# Patient Record
Sex: Male | Born: 1935 | Race: Black or African American | Hispanic: No | Smoking: Former smoker
Health system: Southern US, Community
[De-identification: ages and names within clinical notes are randomized; demographics above are authoritative.]

## PROBLEM LIST (undated history)

## (undated) DIAGNOSIS — E785 Hyperlipidemia, unspecified: Secondary | ICD-10-CM

## (undated) DIAGNOSIS — D72829 Elevated white blood cell count, unspecified: Secondary | ICD-10-CM

## (undated) DIAGNOSIS — I4891 Unspecified atrial fibrillation: Secondary | ICD-10-CM

## (undated) DIAGNOSIS — E119 Type 2 diabetes mellitus without complications: Secondary | ICD-10-CM

## (undated) DIAGNOSIS — N289 Disorder of kidney and ureter, unspecified: Secondary | ICD-10-CM

## (undated) DIAGNOSIS — I214 Non-ST elevation (NSTEMI) myocardial infarction: Secondary | ICD-10-CM

## (undated) DIAGNOSIS — J45909 Unspecified asthma, uncomplicated: Secondary | ICD-10-CM

## (undated) DIAGNOSIS — D539 Nutritional anemia, unspecified: Secondary | ICD-10-CM

## (undated) DIAGNOSIS — D696 Thrombocytopenia, unspecified: Secondary | ICD-10-CM

## (undated) DIAGNOSIS — I509 Heart failure, unspecified: Secondary | ICD-10-CM

## (undated) DIAGNOSIS — J449 Chronic obstructive pulmonary disease, unspecified: Secondary | ICD-10-CM

## (undated) DIAGNOSIS — I10 Essential (primary) hypertension: Secondary | ICD-10-CM

---

## 2016-10-15 DIAGNOSIS — I214 Non-ST elevation (NSTEMI) myocardial infarction: Secondary | ICD-10-CM

## 2016-10-15 HISTORY — DX: Non-ST elevation (NSTEMI) myocardial infarction: I21.4

## 2017-01-12 ENCOUNTER — Encounter (HOSPITAL_COMMUNITY): Payer: Self-pay | Admitting: Emergency Medicine

## 2017-01-12 ENCOUNTER — Inpatient Hospital Stay (HOSPITAL_COMMUNITY)
Admission: EM | Admit: 2017-01-12 | Discharge: 2017-01-17 | DRG: 871 | Disposition: E | Payer: Medicare Other | Attending: Internal Medicine | Admitting: Internal Medicine

## 2017-01-12 ENCOUNTER — Emergency Department (HOSPITAL_COMMUNITY): Payer: Medicare Other

## 2017-01-12 DIAGNOSIS — D631 Anemia in chronic kidney disease: Secondary | ICD-10-CM | POA: Diagnosis not present

## 2017-01-12 DIAGNOSIS — I428 Other cardiomyopathies: Secondary | ICD-10-CM | POA: Diagnosis present

## 2017-01-12 DIAGNOSIS — J9621 Acute and chronic respiratory failure with hypoxia: Secondary | ICD-10-CM | POA: Diagnosis present

## 2017-01-12 DIAGNOSIS — R079 Chest pain, unspecified: Secondary | ICD-10-CM

## 2017-01-12 DIAGNOSIS — Y95 Nosocomial condition: Secondary | ICD-10-CM | POA: Diagnosis present

## 2017-01-12 DIAGNOSIS — D696 Thrombocytopenia, unspecified: Secondary | ICD-10-CM | POA: Diagnosis not present

## 2017-01-12 DIAGNOSIS — N289 Disorder of kidney and ureter, unspecified: Secondary | ICD-10-CM

## 2017-01-12 DIAGNOSIS — I5032 Chronic diastolic (congestive) heart failure: Secondary | ICD-10-CM | POA: Diagnosis present

## 2017-01-12 DIAGNOSIS — A4159 Other Gram-negative sepsis: Secondary | ICD-10-CM | POA: Diagnosis present

## 2017-01-12 DIAGNOSIS — Z7901 Long term (current) use of anticoagulants: Secondary | ICD-10-CM

## 2017-01-12 DIAGNOSIS — E785 Hyperlipidemia, unspecified: Secondary | ICD-10-CM | POA: Diagnosis not present

## 2017-01-12 DIAGNOSIS — A419 Sepsis, unspecified organism: Secondary | ICD-10-CM

## 2017-01-12 DIAGNOSIS — E440 Moderate protein-calorie malnutrition: Secondary | ICD-10-CM | POA: Insufficient documentation

## 2017-01-12 DIAGNOSIS — I132 Hypertensive heart and chronic kidney disease with heart failure and with stage 5 chronic kidney disease, or end stage renal disease: Secondary | ICD-10-CM | POA: Diagnosis present

## 2017-01-12 DIAGNOSIS — R0602 Shortness of breath: Secondary | ICD-10-CM

## 2017-01-12 DIAGNOSIS — Z794 Long term (current) use of insulin: Secondary | ICD-10-CM

## 2017-01-12 DIAGNOSIS — I519 Heart disease, unspecified: Secondary | ICD-10-CM | POA: Diagnosis not present

## 2017-01-12 DIAGNOSIS — E1122 Type 2 diabetes mellitus with diabetic chronic kidney disease: Secondary | ICD-10-CM | POA: Diagnosis present

## 2017-01-12 DIAGNOSIS — J441 Chronic obstructive pulmonary disease with (acute) exacerbation: Secondary | ICD-10-CM | POA: Diagnosis present

## 2017-01-12 DIAGNOSIS — E872 Acidosis: Secondary | ICD-10-CM | POA: Diagnosis present

## 2017-01-12 DIAGNOSIS — J189 Pneumonia, unspecified organism: Secondary | ICD-10-CM | POA: Diagnosis present

## 2017-01-12 DIAGNOSIS — J440 Chronic obstructive pulmonary disease with acute lower respiratory infection: Secondary | ICD-10-CM | POA: Diagnosis present

## 2017-01-12 DIAGNOSIS — J90 Pleural effusion, not elsewhere classified: Secondary | ICD-10-CM

## 2017-01-12 DIAGNOSIS — Z7982 Long term (current) use of aspirin: Secondary | ICD-10-CM | POA: Diagnosis not present

## 2017-01-12 DIAGNOSIS — Z01818 Encounter for other preprocedural examination: Secondary | ICD-10-CM

## 2017-01-12 DIAGNOSIS — K219 Gastro-esophageal reflux disease without esophagitis: Secondary | ICD-10-CM | POA: Diagnosis present

## 2017-01-12 DIAGNOSIS — Z6823 Body mass index (BMI) 23.0-23.9, adult: Secondary | ICD-10-CM

## 2017-01-12 DIAGNOSIS — E2740 Unspecified adrenocortical insufficiency: Secondary | ICD-10-CM | POA: Diagnosis present

## 2017-01-12 DIAGNOSIS — G9341 Metabolic encephalopathy: Secondary | ICD-10-CM | POA: Diagnosis present

## 2017-01-12 DIAGNOSIS — J9620 Acute and chronic respiratory failure, unspecified whether with hypoxia or hypercapnia: Secondary | ICD-10-CM

## 2017-01-12 DIAGNOSIS — Z515 Encounter for palliative care: Secondary | ICD-10-CM | POA: Diagnosis present

## 2017-01-12 DIAGNOSIS — R579 Shock, unspecified: Secondary | ICD-10-CM | POA: Diagnosis not present

## 2017-01-12 DIAGNOSIS — I248 Other forms of acute ischemic heart disease: Secondary | ICD-10-CM | POA: Diagnosis not present

## 2017-01-12 DIAGNOSIS — I4891 Unspecified atrial fibrillation: Secondary | ICD-10-CM | POA: Diagnosis not present

## 2017-01-12 DIAGNOSIS — Z87891 Personal history of nicotine dependence: Secondary | ICD-10-CM

## 2017-01-12 DIAGNOSIS — E441 Mild protein-calorie malnutrition: Secondary | ICD-10-CM | POA: Diagnosis present

## 2017-01-12 DIAGNOSIS — R6520 Severe sepsis without septic shock: Secondary | ICD-10-CM

## 2017-01-12 DIAGNOSIS — I255 Ischemic cardiomyopathy: Secondary | ICD-10-CM | POA: Diagnosis not present

## 2017-01-12 DIAGNOSIS — J9601 Acute respiratory failure with hypoxia: Secondary | ICD-10-CM | POA: Diagnosis not present

## 2017-01-12 DIAGNOSIS — E11649 Type 2 diabetes mellitus with hypoglycemia without coma: Secondary | ICD-10-CM | POA: Diagnosis present

## 2017-01-12 DIAGNOSIS — E669 Obesity, unspecified: Secondary | ICD-10-CM | POA: Diagnosis not present

## 2017-01-12 DIAGNOSIS — J811 Chronic pulmonary edema: Secondary | ICD-10-CM | POA: Diagnosis present

## 2017-01-12 DIAGNOSIS — N186 End stage renal disease: Secondary | ICD-10-CM | POA: Diagnosis present

## 2017-01-12 DIAGNOSIS — K7460 Unspecified cirrhosis of liver: Secondary | ICD-10-CM | POA: Diagnosis present

## 2017-01-12 DIAGNOSIS — Z9981 Dependence on supplemental oxygen: Secondary | ICD-10-CM | POA: Diagnosis not present

## 2017-01-12 DIAGNOSIS — R748 Abnormal levels of other serum enzymes: Secondary | ICD-10-CM | POA: Diagnosis not present

## 2017-01-12 DIAGNOSIS — Z66 Do not resuscitate: Secondary | ICD-10-CM | POA: Diagnosis present

## 2017-01-12 DIAGNOSIS — Z452 Encounter for adjustment and management of vascular access device: Secondary | ICD-10-CM

## 2017-01-12 DIAGNOSIS — Z86718 Personal history of other venous thrombosis and embolism: Secondary | ICD-10-CM

## 2017-01-12 DIAGNOSIS — I482 Chronic atrial fibrillation, unspecified: Secondary | ICD-10-CM | POA: Diagnosis present

## 2017-01-12 DIAGNOSIS — Z79899 Other long term (current) drug therapy: Secondary | ICD-10-CM

## 2017-01-12 DIAGNOSIS — R6521 Severe sepsis with septic shock: Secondary | ICD-10-CM | POA: Diagnosis present

## 2017-01-12 DIAGNOSIS — I252 Old myocardial infarction: Secondary | ICD-10-CM

## 2017-01-12 DIAGNOSIS — N189 Chronic kidney disease, unspecified: Secondary | ICD-10-CM

## 2017-01-12 DIAGNOSIS — J449 Chronic obstructive pulmonary disease, unspecified: Secondary | ICD-10-CM | POA: Diagnosis not present

## 2017-01-12 DIAGNOSIS — E1169 Type 2 diabetes mellitus with other specified complication: Secondary | ICD-10-CM | POA: Diagnosis present

## 2017-01-12 DIAGNOSIS — R071 Chest pain on breathing: Secondary | ICD-10-CM | POA: Diagnosis not present

## 2017-01-12 DIAGNOSIS — N179 Acute kidney failure, unspecified: Secondary | ICD-10-CM | POA: Diagnosis not present

## 2017-01-12 DIAGNOSIS — S2241XA Multiple fractures of ribs, right side, initial encounter for closed fracture: Secondary | ICD-10-CM | POA: Diagnosis present

## 2017-01-12 DIAGNOSIS — S2249XA Multiple fractures of ribs, unspecified side, initial encounter for closed fracture: Secondary | ICD-10-CM

## 2017-01-12 DIAGNOSIS — I2699 Other pulmonary embolism without acute cor pulmonale: Secondary | ICD-10-CM

## 2017-01-12 DIAGNOSIS — I10 Essential (primary) hypertension: Secondary | ICD-10-CM | POA: Diagnosis present

## 2017-01-12 DIAGNOSIS — Z833 Family history of diabetes mellitus: Secondary | ICD-10-CM

## 2017-01-12 HISTORY — DX: Hypocalcemia: E83.51

## 2017-01-12 HISTORY — DX: Unspecified atrial fibrillation: I48.91

## 2017-01-12 HISTORY — DX: Thrombocytopenia, unspecified: D69.6

## 2017-01-12 HISTORY — DX: Chronic obstructive pulmonary disease, unspecified: J44.9

## 2017-01-12 HISTORY — DX: Heart failure, unspecified: I50.9

## 2017-01-12 HISTORY — DX: Essential (primary) hypertension: I10

## 2017-01-12 HISTORY — DX: Hyperlipidemia, unspecified: E78.5

## 2017-01-12 HISTORY — DX: Elevated white blood cell count, unspecified: D72.829

## 2017-01-12 HISTORY — DX: Disorder of kidney and ureter, unspecified: N28.9

## 2017-01-12 HISTORY — DX: Non-ST elevation (NSTEMI) myocardial infarction: I21.4

## 2017-01-12 HISTORY — DX: Type 2 diabetes mellitus without complications: E11.9

## 2017-01-12 HISTORY — DX: Unspecified asthma, uncomplicated: J45.909

## 2017-01-12 HISTORY — DX: Nutritional anemia, unspecified: D53.9

## 2017-01-12 LAB — BASIC METABOLIC PANEL
ANION GAP: 10 (ref 5–15)
BUN: 24 mg/dL — ABNORMAL HIGH (ref 6–20)
CHLORIDE: 95 mmol/L — AB (ref 101–111)
CO2: 28 mmol/L (ref 22–32)
Calcium: 9 mg/dL (ref 8.9–10.3)
Creatinine, Ser: 3.51 mg/dL — ABNORMAL HIGH (ref 0.61–1.24)
GFR calc non Af Amer: 15 mL/min — ABNORMAL LOW (ref >60)
GFR, EST AFRICAN AMERICAN: 17 mL/min — AB (ref >60)
GLUCOSE: 171 mg/dL — AB (ref 65–99)
Potassium: 3.4 mmol/L — ABNORMAL LOW (ref 3.5–5.1)
Sodium: 133 mmol/L — ABNORMAL LOW (ref 135–145)

## 2017-01-12 LAB — CBC
HCT: 28.7 % — ABNORMAL LOW (ref 39.0–52.0)
HEMOGLOBIN: 9.1 g/dL — AB (ref 13.0–17.0)
MCH: 33.1 pg (ref 26.0–34.0)
MCHC: 31.7 g/dL (ref 30.0–36.0)
MCV: 104.4 fL — AB (ref 78.0–100.0)
Platelets: 163 10*3/uL (ref 150–400)
RBC: 2.75 MIL/uL — AB (ref 4.22–5.81)
RDW: 18.5 % — ABNORMAL HIGH (ref 11.5–15.5)
WBC: 12 10*3/uL — ABNORMAL HIGH (ref 4.0–10.5)

## 2017-01-12 LAB — LIPASE, BLOOD: Lipase: 15 U/L (ref 11–51)

## 2017-01-12 LAB — HEPATIC FUNCTION PANEL
ALBUMIN: 3.2 g/dL — AB (ref 3.5–5.0)
ALT: 15 U/L — AB (ref 17–63)
AST: 20 U/L (ref 15–41)
Alkaline Phosphatase: 99 U/L (ref 38–126)
BILIRUBIN INDIRECT: 0.7 mg/dL (ref 0.3–0.9)
Bilirubin, Direct: 0.4 mg/dL (ref 0.1–0.5)
TOTAL PROTEIN: 7.4 g/dL (ref 6.5–8.1)
Total Bilirubin: 1.1 mg/dL (ref 0.3–1.2)

## 2017-01-12 LAB — LIPID PANEL
Cholesterol: 152 mg/dL (ref 0–200)
HDL: 56 mg/dL (ref >40)
LDL CALC: 80 mg/dL (ref 0–99)
Total CHOL/HDL Ratio: 2.7 RATIO
Triglycerides: 79 mg/dL (ref <150)
VLDL: 16 mg/dL (ref 0–40)

## 2017-01-12 LAB — GLUCOSE, CAPILLARY: Glucose-Capillary: 161 mg/dL — ABNORMAL HIGH (ref 65–99)

## 2017-01-12 LAB — TROPONIN I
TROPONIN I: 0.03 ng/mL — AB (ref <0.03)
Troponin I: 0.05 ng/mL (ref <0.03)
Troponin I: 0.06 ng/mL (ref <0.03)

## 2017-01-12 LAB — BRAIN NATRIURETIC PEPTIDE: B Natriuretic Peptide: 2371.7 pg/mL — ABNORMAL HIGH (ref 0.0–100.0)

## 2017-01-12 LAB — PROTIME-INR
INR: 1.63
PROTHROMBIN TIME: 19.6 s — AB (ref 11.4–15.2)

## 2017-01-12 MED ORDER — DOCUSATE SODIUM 283 MG RE ENEM
1.0000 | ENEMA | RECTAL | Status: DC | PRN
  Filled 2017-01-12: qty 1

## 2017-01-12 MED ORDER — NEPRO/CARBSTEADY PO LIQD
237.0000 mL | Freq: Every day | ORAL | Status: DC
  Filled 2017-01-12 (×2): qty 237

## 2017-01-12 MED ORDER — SEVELAMER CARBONATE 800 MG PO TABS
800.0000 mg | ORAL_TABLET | Freq: Three times a day (TID) | ORAL | Status: DC
  Administered 2017-01-14: 800 mg via ORAL
  Filled 2017-01-12: qty 1

## 2017-01-12 MED ORDER — ZOLPIDEM TARTRATE 5 MG PO TABS: 5.0000 mg | ORAL_TABLET | Freq: Every evening | ORAL | Status: DC | PRN

## 2017-01-12 MED ORDER — HYDROXYZINE HCL 25 MG PO TABS: 25.0000 mg | ORAL_TABLET | Freq: Three times a day (TID) | ORAL | Status: DC | PRN

## 2017-01-12 MED ORDER — SODIUM CHLORIDE 0.9 % IV SOLN: 250.0000 mL | INTRAVENOUS | Status: DC | PRN

## 2017-01-12 MED ORDER — SODIUM CHLORIDE 0.9% FLUSH: 3.0000 mL | INTRAVENOUS | Status: DC | PRN

## 2017-01-12 MED ORDER — SODIUM CHLORIDE 0.9% FLUSH
3.0000 mL | Freq: Two times a day (BID) | INTRAVENOUS | Status: DC
  Administered 2017-01-12 – 2017-01-16 (×5): 3 mL via INTRAVENOUS

## 2017-01-12 MED ORDER — CALCIUM CARBONATE ANTACID 1250 MG/5ML PO SUSP
500.0000 mg | Freq: Four times a day (QID) | ORAL | Status: DC | PRN
  Filled 2017-01-12: qty 5

## 2017-01-12 MED ORDER — INSULIN ASPART 100 UNIT/ML ~~LOC~~ SOLN
0.0000 [IU] | Freq: Three times a day (TID) | SUBCUTANEOUS | Status: DC
Start: 2017-01-13 — End: 2017-01-13

## 2017-01-12 MED ORDER — INSULIN ASPART 100 UNIT/ML ~~LOC~~ SOLN: 4.0000 [IU] | Freq: Three times a day (TID) | SUBCUTANEOUS | Status: DC

## 2017-01-12 MED ORDER — ACETAMINOPHEN 325 MG PO TABS: 650.0000 mg | ORAL_TABLET | ORAL | Status: DC | PRN

## 2017-01-12 MED ORDER — ATORVASTATIN CALCIUM 20 MG PO TABS
20.0000 mg | ORAL_TABLET | Freq: Every day | ORAL | Status: DC
  Administered 2017-01-12 – 2017-01-15 (×2): 20 mg via ORAL
  Filled 2017-01-12 (×2): qty 1

## 2017-01-12 MED ORDER — PANTOPRAZOLE SODIUM 40 MG PO TBEC
40.0000 mg | DELAYED_RELEASE_TABLET | Freq: Two times a day (BID) | ORAL | Status: DC
  Administered 2017-01-12: 40 mg via ORAL
  Filled 2017-01-12: qty 1

## 2017-01-12 MED ORDER — ASPIRIN 81 MG PO CHEW: 81.0000 mg | CHEWABLE_TABLET | Freq: Every day | ORAL | Status: DC

## 2017-01-12 MED ORDER — NEPRO/CARBSTEADY PO LIQD
237.0000 mL | Freq: Three times a day (TID) | ORAL | Status: DC | PRN
  Filled 2017-01-12: qty 237

## 2017-01-12 MED ORDER — ONDANSETRON HCL 4 MG/2ML IJ SOLN: 4.0000 mg | Freq: Four times a day (QID) | INTRAMUSCULAR | Status: DC | PRN

## 2017-01-12 MED ORDER — FOLIC ACID-VIT B6-VIT B12 2.5-25-1 MG PO TABS
1.0000 | ORAL_TABLET | Freq: Every day | ORAL | Status: DC
  Administered 2017-01-12 – 2017-01-16 (×3): 1 via ORAL
  Filled 2017-01-12 (×6): qty 1

## 2017-01-12 MED ORDER — APIXABAN 2.5 MG PO TABS
2.5000 mg | ORAL_TABLET | Freq: Two times a day (BID) | ORAL | Status: DC
  Administered 2017-01-12: 2.5 mg via ORAL
  Filled 2017-01-12: qty 1

## 2017-01-12 MED ORDER — FENTANYL CITRATE (PF) 100 MCG/2ML IJ SOLN
12.5000 ug | INTRAMUSCULAR | Status: DC | PRN
  Administered 2017-01-14 – 2017-01-15 (×5): 12.5 ug via INTRAVENOUS
  Filled 2017-01-12 (×5): qty 2

## 2017-01-12 MED ORDER — APIXABAN 5 MG PO TABS: 5.0000 mg | ORAL_TABLET | Freq: Two times a day (BID) | ORAL | Status: DC

## 2017-01-12 MED ORDER — SORBITOL 70 % SOLN: 30.0000 mL | Status: DC | PRN

## 2017-01-12 MED ORDER — CAMPHOR-MENTHOL 0.5-0.5 % EX LOTN
1.0000 "application " | TOPICAL_LOTION | Freq: Three times a day (TID) | CUTANEOUS | Status: DC | PRN
  Filled 2017-01-12: qty 222

## 2017-01-12 NOTE — ED Provider Notes (Signed)
[facility]Y DEPT Provider Note   CSN: [id number] Arrival date & time: 12/20/2016  1047     History   Chief Complaint Chief Complaint  Patient presents with  . Chest Pain    HPI Louis Sutton is a 83 y.o. male.  HPI Pt presents to the ED with complaints of chest pain that started yesterday evening.  It continued throughout the night and resolved some time today.  At this point his pain is gone and he is feeling fine.  The pain was in the lower chest bilaterally down to his upper abdomen.  He felt short of breath.  No fevers, or cough.  No vomiting or diarrhea.    Pt has history of ESRD.  He was dialyzed yesterday.  He was in [facility] over the past weekend for trouble with chest pain, nausea and vomiting.  Per family symptoms were felt to be related to fluid overload.  Past Medical History:  Diagnosis Date  . Asthma   . Atrial fibrillation (HCC)   . CHF (congestive heart failure) (HCC)   . COPD (chronic obstructive pulmonary disease) (HCC)    2L home O2  . Diabetes mellitus without complication (HCC)   . Dyslipidemia   . Hypertension   . Hypocalcemia   . Leukocytosis   . Macrocytic anemia   . NSTEMI (non-ST elevated myocardial infarction) (HCC) 10/15/2016  . Renal disorder    ESRD - [facility] HD  . Thrombocytopenia [facility])     Patient Active Problem List   Diagnosis Date Noted  . Chest pain 01/07/2017  . Chronic diastolic CHF (congestive heart failure) (HCC) 12/22/2016  . ESRD (end stage renal disease) (HCC) 01/03/2017  . Anemia of renal disease 01/01/2017  . Diabetes mellitus type 2 in obese (HCC) 12/21/2016  . Atrial fibrillation, chronic (HCC) 12/31/2016  . Current use of long term anticoagulation 12/25/2016  . Essential hypertension 12/26/2016  . Hyperlipidemia 01/13/2017  . COPD (chronic obstructive pulmonary disease) (HCC) 12/23/2016  . Mild protein-calorie malnutrition (HCC) 01/11/2017    History reviewed. No pertinent surgical  history.     Home Medications    Prior to Admission medications   Medication Sig Start Date End Date Taking? Authorizing Provider  acetaminophen (ACETAMINOPHEN 8 HOUR) 650 MG CR tablet Take 1,300 mg by mouth every 8 (eight) hours as needed for pain.   Yes Historical Provider, MD  albuterol (PROVENTIL HFA;VENTOLIN HFA) 108 (90 Base) MCG/ACT inhaler Inhale 1 puff into the lungs every 6 (six) hours. 02/06/10  Yes Historical Provider, MD  amLODipine (NORVASC) 10 MG tablet Take 1 tablet by mouth daily. 05/22/10  Yes Historical Provider, MD  CALCIUM CARBONATE PO Take 650 mg by mouth daily.   Yes Historical Provider, MD  ELIQUIS 2.5 MG TABS tablet Take 2 tablets by mouth 2 (two) times daily.  01/11/17  Yes Historical Provider, MD  Folic Acid-Vit B6-Vit B12 (FOLBEE) 2.5-25-1 MG TABS tablet Take 1 tablet by mouth daily.   Yes Historical Provider, MD  insulin aspart (NOVOLOG) 100 UNIT/ML injection Inject 4 Units into the skin 3 (three) times daily before meals.   Yes Historical Provider, MD  Nutritional Supplements (NEPRO PO) Take 1 tablet by mouth daily.   Yes Historical Provider, MD  pantoprazole (PROTONIX) 40 MG tablet Take 40 mg by mouth 2 (two) times daily.   Yes Historical Provider, MD  sevelamer carbonate (RENVELA) 800 MG tablet Take 800 mg by mouth 3 (three) times daily with meals.   Yes Historical Provider, MD  aspirin 81 MG chewable tablet Chew 1 tablet by mouth daily. 10/31/16   Historical Provider, MD    Family History History reviewed. No pertinent family history.  Social History Social History  Substance Use Topics  . Smoking status: Former Smoker    Packs/day: 0.50    Years: 10.00  . Smokeless tobacco: Never Used     Comment: x 40 years ago  . Alcohol use No     Allergies   Patient has no known allergies.   Review of Systems Review of Systems  Constitutional: Negative for chills, fatigue and fever.  Respiratory: Positive for shortness of breath.   Gastrointestinal:  Negative for abdominal pain, diarrhea and vomiting.  Musculoskeletal: Negative for back pain.  Neurological: Negative for headaches.  All other systems reviewed and are negative.    Physical Exam Updated Vital Signs BP 96/70   Pulse 97   Temp 98.5 F (36.9 C) (Axillary)   Resp (!) 26   Ht 5\' 9"  (1.753 m)   Wt 72.8 kg   SpO2 100%   BMI 23.72 kg/m   Physical Exam  Constitutional: No distress.  HENT:  Head: Normocephalic and atraumatic.  Right Ear: External ear normal.  Left Ear: External ear normal.  Eyes: Conjunctivae are normal. Right eye exhibits no discharge. Left eye exhibits no discharge. No scleral icterus.  Neck: Neck supple. No tracheal deviation present.  Cardiovascular: Regular rhythm and intact distal pulses.  Tachycardia present.   Pulmonary/Chest: Effort normal and breath sounds normal. No stridor. No respiratory distress. He has no wheezes. He has no rales.  Abdominal: Soft. Bowel sounds are normal. He exhibits no distension. There is no tenderness. There is no rebound and no guarding.  Musculoskeletal: He exhibits no edema or tenderness.  Neurological: He is alert. He has normal strength. No cranial nerve deficit (no facial droop, extraocular movements intact, no slurred speech) or sensory deficit. He exhibits normal muscle tone. He displays no seizure activity. Coordination normal.  Skin: Skin is warm and dry. No rash noted.  Psychiatric: He has a normal mood and affect.  Nursing note and vitals reviewed.    ED Treatments / Results  Labs (all labs ordered are listed, but only abnormal results are displayed) Labs Reviewed  BASIC METABOLIC PANEL - Abnormal; Notable for the following:       Result Value   Sodium 133 (*)    Potassium 3.4 (*)    Chloride 95 (*)    Glucose, Bld 171 (*)    BUN 24 (*)    Creatinine, Ser 3.51 (*)    GFR calc non Af Amer 15 (*)    GFR calc Af Amer 17 (*)    All other components within normal limits  CBC - Abnormal; Notable  for the following:    WBC 12.0 (*)    RBC 2.75 (*)    Hemoglobin 9.1 (*)    HCT 28.7 (*)    MCV 104.4 (*)    RDW 18.5 (*)    All other components within normal limits  TROPONIN I - Abnormal; Notable for the following:    Troponin I 0.03 (*)    All other components within normal limits  HEPATIC FUNCTION PANEL - Abnormal; Notable for the following:    Albumin 3.2 (*)    ALT 15 (*)    All other components within normal limits  TROPONIN I - Abnormal; Notable for the following:    Troponin I 0.05 (*)    All other  components within normal limits  PROTIME-INR - Abnormal; Notable for the following:    Prothrombin Time 19.6 (*)    All other components within normal limits  TROPONIN I - Abnormal; Notable for the following:    Troponin I 0.06 (*)    All other components within normal limits  LIPASE, BLOOD  CBC WITH DIFFERENTIAL/PLATELET  BASIC METABOLIC PANEL  HEMOGLOBIN A1C  TROPONIN I  TROPONIN I  LIPID PANEL  BRAIN NATRIURETIC PEPTIDE    EKG  EKG Interpretation None       Radiology Dg Chest 2 View  Result Date: 02/01/17 CLINICAL DATA:  Chest pain.  Dialysis patient EXAM: CHEST  2 VIEW COMPARISON:  None. FINDINGS: Mild cardiac enlargement.  Negative for edema. Small right pleural effusion. Right lower lobe airspace disease could represent atelectasis or infiltrate. Mild left lower lobe airspace disease and small left effusion. Apical pleural scarring bilaterally.  Left subclavian stent. Dilated colon compatible with adynamic ileus. Multiple displaced right rib fractures, possibly acute. IMPRESSION: Cardiac enlargement with mild vascular congestion Small bilateral effusions and bibasilar atelectasis/infiltrate. Findings suggest fluid overload. Colonic ileus Multiple displaced right rib fractures, possibly acute Electronically Signed   By: Marlan Palau M.D.   On: 2017/02/01 12:34    Procedures Procedures (including critical care time)  Medications Ordered in ED Medications   Folic Acid-Vit B6-Vit B12 (FOLBEE) tablet 1 tablet (1 tablet Oral Given 2017/02/01 2058)  insulin aspart (novoLOG) injection 4 Units (not administered)  feeding supplement (NEPRO CARB STEADY) liquid 237 mL (not administered)  pantoprazole (PROTONIX) EC tablet 40 mg (40 mg Oral Given 2017-02-01 2100)  sevelamer carbonate (RENVELA) tablet 800 mg (not administered)  sodium chloride flush (NS) 0.9 % injection 3 mL (3 mLs Intravenous Given 02-01-2017 2100)  sodium chloride flush (NS) 0.9 % injection 3 mL (not administered)  0.9 %  sodium chloride infusion (not administered)  acetaminophen (TYLENOL) tablet 650 mg (not administered)  ondansetron (ZOFRAN) injection 4 mg (not administered)  insulin aspart (novoLOG) injection 0-15 Units (not administered)  zolpidem (AMBIEN) tablet 5 mg (not administered)  sorbitol 70 % solution 30 mL (not administered)  docusate sodium (ENEMEEZ) enema 283 mg (not administered)  camphor-menthol (SARNA) lotion 1 application (not administered)    And  hydrOXYzine (ATARAX/VISTARIL) tablet 25 mg (not administered)  calcium carbonate (dosed in mg elemental calcium) suspension 500 mg of elemental calcium (not administered)  feeding supplement (NEPRO CARB STEADY) liquid 237 mL (not administered)  apixaban (ELIQUIS) tablet 2.5 mg (2.5 mg Oral Given 02-01-2017 2100)  fentaNYL (SUBLIMAZE) injection 12.5 mcg (not administered)  atorvastatin (LIPITOR) tablet 20 mg (20 mg Oral Given February 01, 2017 2203)     Initial Impression / Assessment and Plan / ED Course  I have reviewed the triage vital signs and the nursing notes.  Pertinent labs & imaging results that were available during my care of the patient were reviewed by me and considered in my medical decision making (see chart for details).  Clinical Course as of Jan 13 2211  Tue 02/01/17  1216 EKG  A fib rate 107, Right axis deviation, nonspecific st changes diffusely, no prior EKG for comparison  [JK]  1514 Discussed the case with  the patient's daughter. She is concerned about his declining status. Patient normally was up and walking around. He was just discharged from the hospital yesterday and she does not think that he really has recovered.   Patient's hemoglobin is 9.1. This is similar to values from his recent hospitalization. Hemoglobin  was 7.8 at [facility].   [JK]  1515 Patient does have persistent mild tachycardia. He also has persistent tachypnea. Symptoms may be multifactorial but could be related to persistent fluid overload as evidenced by the chest x-ray.  [JK]  1515 The troponin is upper limits of normal heart size any active chest pain at this point.   [JK]  1515 Considering his multiple comorbidities and persistent symptoms of chest pain last night and shortness of breath I will consult the medical service for admission further evaluation.  Daughter dose have some concerns about the need for a nursing facility  [JK]    Clinical Course User Index [JK] Linwood Dibbles, MD     Final Clinical Impressions(s) / ED Diagnoses   Final diagnoses:  Chronic renal failure, unspecified CKD stage  Chronic pulmonary edema  Chest pain, unspecified type    New Prescriptions Current Discharge Medication List       Linwood Dibbles, MD 01/01/2017 2212

## 2017-01-12 NOTE — ED Triage Notes (Signed)
Pt seen at [facility] inpatient for CHF/PNA on Friday and d/c'd back to assisted living yesterday. Pt c/o chest pain since last night with back pain and dizziness. nad noted. Pt last dialyzed yesterday.

## 2017-01-12 NOTE — ED Notes (Signed)
Attempted to call report to [facility].  Secretary states the nurse will call me back, he was in a room with a pt right now.

## 2017-01-12 NOTE — H&P (Signed)
History and Physical    Louis Sutton WUJ:[id number] DOB: November 08, 1931 DOA: February 10, 2017  PCP: [facility] Consultants:  Earna Coder - cardiology [facility]); Cain Sieve - nephrology [facility]); Samuella Cota - GI [facility]) Patient coming from: assisted living, [place]; [place]: niece, [phone]  Chief Complaint:  Chest pain  HPI: Louis Sutton is a 83 y.o. male with medical history significant of ESRD on MWF HD; thrombocytopenia; NSTEMI in 12/17; HTN; HLD; DM; COPD on home O2; CHF (unspecified, no records available); and persistent afib.  His niece provides the majority of the history and reports that he has been having problems since Dec 27 - "minor heart attack" but also septic, in ICU in [place] at [place].  ICU for 2 weeks, discharged to [facility] x 20 days.  Then, unable to return home alone, he moved to Assisted Living.  Since Friday, he has had severe SOB, n/v, abdominal pain, and chest pain.  Admitted to [facility] Friday night, BNP was 3526 after a full day of dialysis.  He was given 2 large doses of Lasix, but he does not make urine.  They were planning to do Echo and monitor BNP but those things did not apparently happen.  He was discharged yesterday and symptoms redeveloped within 6 hours.  He had chest pain all night long, unable to sleep, and this has continued on and off throughout the day.  Chest pain is all the way across the base of his chest.  "Quieten down and do like it supposed to do for a while until I start moving around a lot." He does not walk, but changing positions seems to make it worse.  Breathing deeply makes it better.  Last n/v was Friday.  SOB sometimes with sitting still and other times with moving, it is sporadic.  Stays cold a lot.  Multiple transfusions in the last few months - [place] in December and 2 in February, no idea why - EGD negative, colonoscopy deferred due to recent MI.  Dark stools in rehab.  Last stress test was in [place]  2 weeks agoand it was normal.  Echo was scheduled for Thursday, prior maybe in January.  Has never had a heart cath.   RLE edema x 1 month, less on left.  Decreased appetite for a while.  Unintentional weight loss of 40 pounds since December (estimated).  No URI symptoms.  Chronic cough, has COPD but ?related to cardiac.  Occasionally productive cough, clear with bubbles in it.  ?abdominal CT on Saturday, reportedly clear.  Epic Care Everywhere chart reviewed. Hospitalized at [facility] from 10/16/16-10/31/16.   Discharge diagnoses include sepsis due to E coli; type 2 MI; ESRD; DM; afib; RUE DVT (on Eliquis for minimum of 6 months); cirrhosis; and adrenal nodule.  Echo on 10/16/16 showed preserved EF with diastolic dysfunction.   ED Course:   Afib , mildly elevated troponin, ?pulmonary edema on CXR  Review of Systems: As per HPI; otherwise 10 point review of systems reviewed and negative.   Ambulatory Status:  Non-ambulatory since December  Past Medical History:  Diagnosis Date  . Asthma   . Atrial fibrillation (HCC)   . CHF (congestive heart failure) (HCC)   . COPD (chronic obstructive pulmonary disease) (HCC)    2L home O2  . Diabetes mellitus without complication (HCC)   . Dyslipidemia   . Hypertension   . Hypocalcemia   . Leukocytosis   . Macrocytic anemia   . NSTEMI (non-ST elevated myocardial infarction) (HCC) 10/15/2016  .  Renal disorder    ESRD - MWF HD  . Thrombocytopenia (HCC)     History reviewed. No pertinent surgical history.  Social History   Social History  . Marital status: Widowed    Spouse name: N/A  . Number of children: N/A  . Years of education: N/A   Occupational History  . Not on file.   Social History Main Topics  . Smoking status: Former Smoker    Packs/day: 0.50    Years: 10.00  . Smokeless tobacco: Never Used     Comment: x 40 years ago  . Alcohol use No  . Drug use: No  . Sexual activity: Not on file   Other Topics Concern  . Not on file     Social History Narrative  . No narrative on file    No Known Allergies  History reviewed. No pertinent family history.  Prior to Admission medications   Medication Sig Start Date End Date Taking? Authorizing Provider  acetaminophen (ACETAMINOPHEN 8 HOUR) 650 MG CR tablet Take 1,300 mg by mouth every 8 (eight) hours as needed for pain.   Yes Historical Provider, MD  albuterol (PROVENTIL HFA;VENTOLIN HFA) 108 (90 Base) MCG/ACT inhaler Inhale 1 puff into the lungs every 6 (six) hours. 02/06/10  Yes Historical Provider, MD  amLODipine (NORVASC) 10 MG tablet Take 1 tablet by mouth daily. 05/22/10  Yes Historical Provider, MD  CALCIUM CARBONATE PO Take 650 mg by mouth daily.   Yes Historical Provider, MD  dronabinol (MARINOL) 2.5 MG capsule Take 2.5 mg by mouth 2 (two) times daily before lunch and supper.   Yes Historical Provider, MD  ELIQUIS 2.5 MG TABS tablet Take 2 tablets by mouth 2 (two) times daily.  01/11/17  Yes Historical Provider, MD  Folic Acid-Vit B6-Vit B12 (FOLBEE) 2.5-25-1 MG TABS tablet Take 1 tablet by mouth daily.   Yes Historical Provider, MD  insulin aspart (NOVOLOG) 100 UNIT/ML injection Inject 4 Units into the skin 3 (three) times daily before meals.   Yes Historical Provider, MD  Nutritional Supplements (NEPRO PO) Take 1 tablet by mouth daily.   Yes Historical Provider, MD  pantoprazole (PROTONIX) 40 MG tablet Take 40 mg by mouth 2 (two) times daily.   Yes Historical Provider, MD  sevelamer carbonate (RENVELA) 800 MG tablet Take 800 mg by mouth 3 (three) times daily with meals.   Yes Historical Provider, MD  aspirin 81 MG chewable tablet Chew 1 tablet by mouth daily. 10/31/16   Historical Provider, MD    Physical Exam: Vitals:   2017-02-10 1752 Feb 10, 2017 1848 Feb 10, 2017 1939 02-10-2017 1940  BP:  (!) 98/32  96/70  Pulse:  100  97  Resp: (!) 22 (!) 30  (!) 26  Temp:  97.5 F (36.4 C) 98.5 F (36.9 C)   TempSrc:  Oral Axillary   SpO2: 100% 94%  100%  Weight:  72.8 kg (160  lb 9.6 oz)    Height:  5\' 9"  (1.753 m)       General:  Appears calm and comfortable and is NAD Eyes:  PERRL, EOMI, normal lids, iris ENT:  grossly normal hearing, lips & tongue, mmm Neck:  no LAD, masses or thyromegaly Cardiovascular:  Irregularly irregular with mild tachycardia, 2/6 systolic murmur, no r/g. No LE edema.  Respiratory:  CTA bilaterally, no w/r/r. Normal respiratory effort. Abdomen:  soft, ntnd, NABS Skin:  no rash or induration seen on limited exam Musculoskeletal:  grossly normal tone BUE/BLE, good ROM, no bony abnormality  Psychiatric:  grossly normal mood and affect, speech fluent and appropriate, AOx3 Neurologic:  CN 2-12 grossly intact, moves all extremities in coordinated fashion, sensation intact  Labs on Admission: I have personally reviewed following labs and imaging studies  CBC:  Recent Labs Lab 12/20/2016 1144  WBC 12.0*  HGB 9.1*  HCT 28.7*  MCV 104.4*  PLT 163   Basic Metabolic Panel:  Recent Labs Lab 01/11/2017 1144  NA 133*  K 3.4*  CL 95*  CO2 28  GLUCOSE 171*  BUN 24*  CREATININE 3.51*  CALCIUM 9.0   GFR: Estimated Creatinine Clearance: 16.2 mL/min (A) (by C-G formula based on SCr of 3.51 mg/dL (H)). Liver Function Tests:  Recent Labs Lab 01/02/2017 1144  AST 20  ALT 15*  ALKPHOS 99  BILITOT 1.1  PROT 7.4  ALBUMIN 3.2*    Recent Labs Lab 12/22/2016 1144  LIPASE 15   No results for input(s): AMMONIA in the last 168 hours. Coagulation Profile:  Recent Labs Lab 12/19/2016 1144  INR 1.63   Cardiac Enzymes:  Recent Labs Lab 12/31/2016 1144 12/22/2016 1442 12/22/2016 1919  TROPONINI 0.03* 0.05* 0.06*   BNP (last 3 results) No results for input(s): PROBNP in the last 8760 hours. HbA1C: No results for input(s): HGBA1C in the last 72 hours. CBG: No results for input(s): GLUCAP in the last 168 hours. Lipid Profile: No results for input(s): CHOL, HDL, LDLCALC, TRIG, CHOLHDL, LDLDIRECT in the last 72 hours. Thyroid Function  Tests: No results for input(s): TSH, T4TOTAL, FREET4, T3FREE, THYROIDAB in the last 72 hours. Anemia Panel: No results for input(s): VITAMINB12, FOLATE, FERRITIN, TIBC, IRON, RETICCTPCT in the last 72 hours. Urine analysis: No results found for: COLORURINE, APPEARANCEUR, LABSPEC, PHURINE, GLUCOSEU, HGBUR, BILIRUBINUR, KETONESUR, PROTEINUR, UROBILINOGEN, NITRITE, LEUKOCYTESUR  Creatinine Clearance: Estimated Creatinine Clearance: 16.2 mL/min (A) (by C-G formula based on SCr of 3.51 mg/dL (H)).  Sepsis Labs: @LABRCNTIP (procalcitonin:4,lacticidven:4) )No results found for this or any previous visit (from the past 240 hour(s)).   Radiological Exams on Admission: Dg Chest 2 View  Result Date: 12/19/2016 CLINICAL DATA:  Chest pain.  Dialysis patient EXAM: CHEST  2 VIEW COMPARISON:  None. FINDINGS: Mild cardiac enlargement.  Negative for edema. Small right pleural effusion. Right lower lobe airspace disease could represent atelectasis or infiltrate. Mild left lower lobe airspace disease and small left effusion. Apical pleural scarring bilaterally.  Left subclavian stent. Dilated colon compatible with adynamic ileus. Multiple displaced right rib fractures, possibly acute. IMPRESSION: Cardiac enlargement with mild vascular congestion Small bilateral effusions and bibasilar atelectasis/infiltrate. Findings suggest fluid overload. Colonic ileus Multiple displaced right rib fractures, possibly acute Electronically Signed   By: Marlan Palau M.D.   On: 12/24/2016 12:34    EKG: pending x 2  Assessment/Plan Principal Problem:   Chest pain Active Problems:   Chronic diastolic CHF (congestive heart failure) (HCC)   ESRD (end stage renal disease) (HCC)   Anemia of renal disease   Diabetes mellitus type 2 in obese [facility])   Atrial fibrillation, chronic (HCC)   Current use of long term anticoagulation   Essential hypertension   Hyperlipidemia   COPD (chronic obstructive pulmonary disease) (HCC)   Mild  protein-calorie malnutrition (HCC)   Chest pain -Patient with reported h/o "type 2 myocardial infarction" in 09/2016 (troponin peaked at 1.76) -Recent negative stress test -No recent cardiac catheterization  -Diffuse lower chest pain that is intermittently present for the last 24 hours; possibly exertional, although the patient has minimal ability to exert himself  these day; relieved intermittently with deep breathing  -CXR suggestive of volume overload (see below) -Initial cardiac troponin minimally positive x 2, previously negative in 1/18. -EKG pending.   -Will plan to admit on telemetry to rule out ACS by overnight observation.  -cycle troponin q6h x 3 and repeat EKG in AM -Continue ASA 81 mg  daily -fentanyl given prn severe pain -Cardiology consultation in AM - order placed through Inbox message to [employer]  HTN -Takes Norvasc at home - this is likely not ideal given his volume overload issues, will hold -No likely benefit of ACE given ESRD -Would benefit from beta blocker if BP would tolerate -Cardizem may also be a good substitute for Norvasc. -Currently stable, so will not order BP meds at this time  HLD -Does not appear to be taking statin therapy -Lipid profile not appreciated on Epic chart review, will order -Will start Lipitor 20 mg qhs empirically for now  DM -Glucose 171 -Appears to only be taking TID Novolog -A1c is pending -Will cover with SSI for now -May benefit from more long-acting glucose controller  CHF -Echo in 12/17 showed preserved EF with undefined diastolic dysfunction -BNP not appreciated during prior hospitalization records -Niece reports BNP in [place] last weekend was markedly elevated, but uncertain baseline particularly with ESRD -CXR appears to show volume overload -Patient does not appear obviously volume overloaded at the time of admission -No LE edema, no crackles -Will check BNP -Patient for dialysis tomorrow (see below), may  benefit from having excess volume taken off -Repeat Echo  ESRD -MWF HD -Day team to request AM consult since patient is due for HD tomorrow -May benefit from additional volume removed due to possible mild CHF -Continue Renvela  Anemia -Patient with reported dark stools in January -During prior hospitalization, he did have EGD but declined colonoscopy -He continues on anticoagulation (see below); if this were a significant bleed, he would likely have had severe hemorrhage by now -Can consider outpatient GI evaluation unless new issues arise -For now, likely anemia of CKD -Hgb at 9.1 appears to be improved from prior hospitalization (in 6.7-9 range)  Afib -Chronic -Reasonable but not ideal rate control - in 100 range -INR 1.63 - ?cause -On Eliquis, continue with renal/age adjustments in dosing per pharmacy -He may benefit from beta blocker, CCB, or amiodarone (if BP unable to tolerate rate controlling medications) -Cardiology consultation in AM  COPD -Chronic respiratory failure -Does not appear to be significantly different from baseline (other than possible mild pulmonary edema) -Continue home O2  Malnutrition -Albumin 3.2 -Previously prescribed Marinol, could not afford -Nutrition consult  Other -Family has concerns about the patient returning to assisted living -Seems likely to need placement -SW consult requested  DVT prophylaxis: Eliquis Code Status: Full - confirmed with patient/family Family Communication: Niece and his sisters were present throughout evaluation  Disposition Plan: Likely to need SNF placement Consults called: Cardiology (via Inbox message); will need nephrology in AM; SW, nutrition, PT, OT Admission status: Admit to [facility] - It is my clinical opinion that admission to INPATIENT is reasonable and necessary because this patient will require at least 2 midnights in the hospital to treat this condition based on the medical complexity of the problems  presented.  Given the aforementioned information, the predictability of an adverse outcome is felt to be significant.    Jonah Blue MD [facility]  If 7PM-7AM, please contact night-coverage [email or web address] Password [facility]  01/05/2017, 9:18 PM

## 2017-01-13 ENCOUNTER — Inpatient Hospital Stay (HOSPITAL_COMMUNITY): Payer: Medicare Other

## 2017-01-13 ENCOUNTER — Encounter (HOSPITAL_COMMUNITY): Payer: Self-pay | Admitting: Student

## 2017-01-13 DIAGNOSIS — R6520 Severe sepsis without septic shock: Secondary | ICD-10-CM

## 2017-01-13 DIAGNOSIS — R079 Chest pain, unspecified: Secondary | ICD-10-CM

## 2017-01-13 DIAGNOSIS — R748 Abnormal levels of other serum enzymes: Secondary | ICD-10-CM

## 2017-01-13 DIAGNOSIS — R579 Shock, unspecified: Secondary | ICD-10-CM

## 2017-01-13 DIAGNOSIS — R6521 Severe sepsis with septic shock: Secondary | ICD-10-CM

## 2017-01-13 DIAGNOSIS — S2249XA Multiple fractures of ribs, unspecified side, initial encounter for closed fracture: Secondary | ICD-10-CM

## 2017-01-13 DIAGNOSIS — N186 End stage renal disease: Secondary | ICD-10-CM

## 2017-01-13 DIAGNOSIS — E440 Moderate protein-calorie malnutrition: Secondary | ICD-10-CM | POA: Insufficient documentation

## 2017-01-13 DIAGNOSIS — I4891 Unspecified atrial fibrillation: Secondary | ICD-10-CM

## 2017-01-13 DIAGNOSIS — I482 Chronic atrial fibrillation: Secondary | ICD-10-CM

## 2017-01-13 DIAGNOSIS — N289 Disorder of kidney and ureter, unspecified: Secondary | ICD-10-CM

## 2017-01-13 DIAGNOSIS — J9621 Acute and chronic respiratory failure with hypoxia: Secondary | ICD-10-CM

## 2017-01-13 DIAGNOSIS — A419 Sepsis, unspecified organism: Secondary | ICD-10-CM

## 2017-01-13 DIAGNOSIS — J449 Chronic obstructive pulmonary disease, unspecified: Secondary | ICD-10-CM

## 2017-01-13 DIAGNOSIS — J9620 Acute and chronic respiratory failure, unspecified whether with hypoxia or hypercapnia: Secondary | ICD-10-CM

## 2017-01-13 DIAGNOSIS — J9601 Acute respiratory failure with hypoxia: Secondary | ICD-10-CM

## 2017-01-13 DIAGNOSIS — N179 Acute kidney failure, unspecified: Secondary | ICD-10-CM

## 2017-01-13 LAB — AMMONIA: AMMONIA: 36 umol/L — AB (ref 9–35)

## 2017-01-13 LAB — BLOOD CULTURE ID PANEL (REFLEXED)
ACINETOBACTER BAUMANNII: NOT DETECTED
CANDIDA ALBICANS: NOT DETECTED
CANDIDA GLABRATA: NOT DETECTED
CANDIDA TROPICALIS: NOT DETECTED
Candida krusei: NOT DETECTED
Candida parapsilosis: NOT DETECTED
Carbapenem resistance: NOT DETECTED
ENTEROBACTER CLOACAE COMPLEX: NOT DETECTED
ESCHERICHIA COLI: NOT DETECTED
Enterobacteriaceae species: DETECTED — AB
Enterococcus species: NOT DETECTED
Haemophilus influenzae: NOT DETECTED
Klebsiella oxytoca: NOT DETECTED
Klebsiella pneumoniae: DETECTED — AB
Listeria monocytogenes: NOT DETECTED
Neisseria meningitidis: NOT DETECTED
PROTEUS SPECIES: NOT DETECTED
PSEUDOMONAS AERUGINOSA: NOT DETECTED
STREPTOCOCCUS PNEUMONIAE: NOT DETECTED
STREPTOCOCCUS PYOGENES: NOT DETECTED
Serratia marcescens: NOT DETECTED
Staphylococcus aureus (BCID): NOT DETECTED
Staphylococcus species: NOT DETECTED
Streptococcus agalactiae: NOT DETECTED
Streptococcus species: NOT DETECTED

## 2017-01-13 LAB — LACTIC ACID, PLASMA
LACTIC ACID, VENOUS: 6.3 mmol/L — AB (ref 0.5–1.9)
Lactic Acid, Venous: 4 mmol/L (ref 0.5–1.9)

## 2017-01-13 LAB — BLOOD GAS, ARTERIAL
ACID-BASE EXCESS: 0.4 mmol/L (ref 0.0–2.0)
BICARBONATE: 24.1 mmol/L (ref 20.0–28.0)
DRAWN BY: 441661
FIO2: 1
O2 SAT: 100 %
PH ART: 7.439 (ref 7.350–7.450)
Patient temperature: 98.6
pCO2 arterial: 36.2 mmHg (ref 32.0–48.0)
pO2, Arterial: 220 mmHg — ABNORMAL HIGH (ref 83.0–108.0)

## 2017-01-13 LAB — BASIC METABOLIC PANEL
Anion gap: 13 (ref 5–15)
BUN: 34 mg/dL — AB (ref 6–20)
CO2: 25 mmol/L (ref 22–32)
CREATININE: 4.41 mg/dL — AB (ref 0.61–1.24)
Calcium: 8.8 mg/dL — ABNORMAL LOW (ref 8.9–10.3)
Chloride: 97 mmol/L — ABNORMAL LOW (ref 101–111)
GFR calc Af Amer: 13 mL/min — ABNORMAL LOW (ref >60)
GFR, EST NON AFRICAN AMERICAN: 11 mL/min — AB (ref >60)
GLUCOSE: 75 mg/dL (ref 65–99)
Potassium: 3.6 mmol/L (ref 3.5–5.1)
SODIUM: 135 mmol/L (ref 135–145)

## 2017-01-13 LAB — GLUCOSE, CAPILLARY
GLUCOSE-CAPILLARY: 69 mg/dL (ref 65–99)
Glucose-Capillary: 46 mg/dL — ABNORMAL LOW (ref 65–99)
Glucose-Capillary: 56 mg/dL — ABNORMAL LOW (ref 65–99)
Glucose-Capillary: 66 mg/dL (ref 65–99)
Glucose-Capillary: 83 mg/dL (ref 65–99)
Glucose-Capillary: 91 mg/dL (ref 65–99)
Glucose-Capillary: 81 mg/dL (ref 65–99)
Glucose-Capillary: 154 mg/dL — ABNORMAL HIGH (ref 65–99)

## 2017-01-13 LAB — CBC WITH DIFFERENTIAL/PLATELET
Basophils Absolute: 0 10*3/uL (ref 0.0–0.1)
Basophils Relative: 0 %
EOS PCT: 0 %
Eosinophils Absolute: 0 10*3/uL (ref 0.0–0.7)
HCT: 30.7 % — ABNORMAL LOW (ref 39.0–52.0)
Hemoglobin: 9.4 g/dL — ABNORMAL LOW (ref 13.0–17.0)
LYMPHS ABS: 0.3 10*3/uL — AB (ref 0.7–4.0)
Lymphocytes Relative: 5 %
MCH: 31.9 pg (ref 26.0–34.0)
MCHC: 30.6 g/dL (ref 30.0–36.0)
MCV: 104.1 fL — AB (ref 78.0–100.0)
MONO ABS: 0.2 10*3/uL (ref 0.1–1.0)
MONOS PCT: 4 %
Neutro Abs: 5.6 10*3/uL (ref 1.7–7.7)
Neutrophils Relative %: 91 %
PLATELETS: 154 10*3/uL (ref 150–400)
RBC: 2.95 MIL/uL — ABNORMAL LOW (ref 4.22–5.81)
RDW: 18.4 % — AB (ref 11.5–15.5)
WBC: 6.2 10*3/uL (ref 4.0–10.5)

## 2017-01-13 LAB — HEMOGLOBIN A1C
HEMOGLOBIN A1C: 5.2 % (ref 4.8–5.6)
Mean Plasma Glucose: 103 mg/dL

## 2017-01-13 LAB — TROPONIN I
Troponin I: 0.06 ng/mL (ref <0.03)
Troponin I: 0.38 ng/mL (ref <0.03)
Troponin I: 1.34 ng/mL (ref <0.03)
Troponin I: 1.47 ng/mL (ref <0.03)

## 2017-01-13 LAB — D-DIMER, QUANTITATIVE (NOT AT ARMC): D DIMER QUANT: 8.19 ug{FEU}/mL — AB (ref 0.00–0.50)

## 2017-01-13 LAB — PROCALCITONIN: Procalcitonin: 16.83 ng/mL

## 2017-01-13 LAB — APTT: aPTT: 128 seconds — ABNORMAL HIGH (ref 24–36)

## 2017-01-13 LAB — CORTISOL: CORTISOL PLASMA: 92.1 ug/dL

## 2017-01-13 MED ORDER — FENTANYL CITRATE (PF) 100 MCG/2ML IJ SOLN
INTRAMUSCULAR | Status: AC
  Filled 2017-01-13: qty 2

## 2017-01-13 MED ORDER — FENTANYL CITRATE (PF) 100 MCG/2ML IJ SOLN
INTRAMUSCULAR | Status: AC
  Administered 2017-01-13: 25 ug
  Filled 2017-01-13: qty 2

## 2017-01-13 MED ORDER — VANCOMYCIN HCL 10 G IV SOLR
1500.0000 mg | Freq: Once | INTRAVENOUS | Status: AC
  Administered 2017-01-13: 1500 mg via INTRAVENOUS
  Filled 2017-01-13: qty 1500

## 2017-01-13 MED ORDER — PRISMASOL BGK 4/2.5 32-4-2.5 MEQ/L IV SOLN
INTRAVENOUS | Status: DC
  Administered 2017-01-14 – 2017-01-16 (×3): via INTRAVENOUS_CENTRAL
  Filled 2017-01-13 (×6): qty 5000

## 2017-01-13 MED ORDER — AMIODARONE IV BOLUS ONLY 150 MG/100ML
150.0000 mg | Freq: Once | INTRAVENOUS | Status: AC
  Administered 2017-01-13: 150 mg via INTRAVENOUS
  Filled 2017-01-13: qty 100

## 2017-01-13 MED ORDER — BUDESONIDE 0.25 MG/2ML IN SUSP
0.2500 mg | Freq: Two times a day (BID) | RESPIRATORY_TRACT | Status: DC
  Administered 2017-01-13 – 2017-01-16 (×7): 0.25 mg via RESPIRATORY_TRACT
  Filled 2017-01-13 (×7): qty 2

## 2017-01-13 MED ORDER — DEXTROSE 50 % IV SOLN
1.0000 | Freq: Once | INTRAVENOUS | Status: AC
  Administered 2017-01-13: 50 mL via INTRAVENOUS

## 2017-01-13 MED ORDER — AMIODARONE HCL IN DEXTROSE 360-4.14 MG/200ML-% IV SOLN
30.0000 mg/h | INTRAVENOUS | Status: DC
  Administered 2017-01-13 – 2017-01-16 (×7): 30 mg/h via INTRAVENOUS
  Filled 2017-01-13 (×7): qty 200

## 2017-01-13 MED ORDER — DEXTROSE 5 % IV SOLN
INTRAVENOUS | Status: DC
  Administered 2017-01-13 – 2017-01-14 (×2): via INTRAVENOUS

## 2017-01-13 MED ORDER — HEPARIN (PORCINE) IN NACL 100-0.45 UNIT/ML-% IJ SOLN
1100.0000 [IU]/h | INTRAMUSCULAR | Status: DC
  Administered 2017-01-13: 1100 [IU]/h via INTRAVENOUS
  Filled 2017-01-13: qty 250

## 2017-01-13 MED ORDER — NOREPINEPHRINE 4 MG/250ML-% IV SOLN
0.0000 ug/min | INTRAVENOUS | Status: DC
  Administered 2017-01-13: 25 ug/min via INTRAVENOUS
  Filled 2017-01-13 (×2): qty 250

## 2017-01-13 MED ORDER — SODIUM CHLORIDE 0.9 % IV SOLN
0.0000 ug/min | INTRAVENOUS | Status: DC
  Filled 2017-01-13 (×5): qty 1

## 2017-01-13 MED ORDER — CEFEPIME HCL 2 G IJ SOLR
2.0000 g | Freq: Once | INTRAMUSCULAR | Status: DC
  Filled 2017-01-13: qty 2

## 2017-01-13 MED ORDER — SODIUM CHLORIDE 0.9 % IV BOLUS (SEPSIS)
250.0000 mL | Freq: Once | INTRAVENOUS | Status: AC
Start: 2017-01-13 — End: 2017-01-13
  Administered 2017-01-13: 250 mL via INTRAVENOUS

## 2017-01-13 MED ORDER — SODIUM CHLORIDE 0.9 % IV BOLUS (SEPSIS)
250.0000 mL | Freq: Once | INTRAVENOUS | Status: AC
  Administered 2017-01-13: 250 mL via INTRAVENOUS

## 2017-01-13 MED ORDER — SODIUM CHLORIDE 0.9 % FOR CRRT
INTRAVENOUS_CENTRAL | Status: DC | PRN
  Filled 2017-01-13: qty 1000

## 2017-01-13 MED ORDER — HEPARIN SODIUM (PORCINE) 1000 UNIT/ML DIALYSIS
1000.0000 [IU] | INTRAMUSCULAR | Status: DC | PRN
  Administered 2017-01-15 – 2017-01-16 (×2): 3000 [IU] via INTRAVENOUS_CENTRAL
  Filled 2017-01-13: qty 3
  Filled 2017-01-13: qty 6
  Filled 2017-01-13: qty 4
  Filled 2017-01-13: qty 2
  Filled 2017-01-13 (×2): qty 6

## 2017-01-13 MED ORDER — AMIODARONE HCL IN DEXTROSE 360-4.14 MG/200ML-% IV SOLN
60.0000 mg/h | INTRAVENOUS | Status: AC
  Administered 2017-01-13: 60 mg/h via INTRAVENOUS
  Filled 2017-01-13: qty 200

## 2017-01-13 MED ORDER — DILTIAZEM HCL 25 MG/5ML IV SOLN
10.0000 mg | Freq: Once | INTRAVENOUS | Status: AC
  Administered 2017-01-13: 10 mg via INTRAVENOUS
  Filled 2017-01-13: qty 5

## 2017-01-13 MED ORDER — SODIUM CHLORIDE 0.9 % IV BOLUS (SEPSIS): 250.0000 mL | Freq: Once | INTRAVENOUS | Status: DC

## 2017-01-13 MED ORDER — ORAL CARE MOUTH RINSE
15.0000 mL | Freq: Two times a day (BID) | OROMUCOSAL | Status: DC
  Administered 2017-01-14 – 2017-01-15 (×2): 15 mL via OROMUCOSAL

## 2017-01-13 MED ORDER — IOPAMIDOL (ISOVUE-370) INJECTION 76%
INTRAVENOUS | Status: AC
  Administered 2017-01-13: 100 mL
  Filled 2017-01-13: qty 100

## 2017-01-13 MED ORDER — PIPERACILLIN-TAZOBACTAM 3.375 G IVPB
3.3750 g | Freq: Two times a day (BID) | INTRAVENOUS | Status: DC
  Administered 2017-01-13 (×2): 3.375 g via INTRAVENOUS
  Filled 2017-01-13 (×3): qty 50

## 2017-01-13 MED ORDER — ALBUTEROL SULFATE (2.5 MG/3ML) 0.083% IN NEBU: 2.5000 mg | INHALATION_SOLUTION | RESPIRATORY_TRACT | Status: DC | PRN

## 2017-01-13 MED ORDER — CHLORHEXIDINE GLUCONATE 0.12 % MT SOLN
15.0000 mL | Freq: Two times a day (BID) | OROMUCOSAL | Status: DC
  Administered 2017-01-13 – 2017-01-15 (×4): 15 mL via OROMUCOSAL
  Filled 2017-01-13 (×2): qty 15

## 2017-01-13 MED ORDER — SODIUM CHLORIDE 0.9 % IV SOLN
0.0000 ug/min | INTRAVENOUS | Status: DC
  Administered 2017-01-13: 400 ug/min via INTRAVENOUS
  Administered 2017-01-13: 310 ug/min via INTRAVENOUS
  Administered 2017-01-13: 100 ug/min via INTRAVENOUS
  Administered 2017-01-13 (×2): 300 ug/min via INTRAVENOUS
  Administered 2017-01-14: 210 ug/min via INTRAVENOUS
  Administered 2017-01-14: 80 ug/min via INTRAVENOUS
  Administered 2017-01-14: 150 ug/min via INTRAVENOUS
  Administered 2017-01-14: 110 ug/min via INTRAVENOUS
  Administered 2017-01-15: 130 ug/min via INTRAVENOUS
  Administered 2017-01-15: 200 ug/min via INTRAVENOUS
  Filled 2017-01-13 (×13): qty 4

## 2017-01-13 MED ORDER — HEPARIN (PORCINE) IN NACL 100-0.45 UNIT/ML-% IJ SOLN
1100.0000 [IU]/h | INTRAMUSCULAR | Status: DC
  Administered 2017-01-14: 1100 [IU]/h via INTRAVENOUS
  Filled 2017-01-13: qty 250

## 2017-01-13 MED ORDER — PRISMASOL BGK 4/2.5 32-4-2.5 MEQ/L IV SOLN
INTRAVENOUS | Status: DC
  Administered 2017-01-14 – 2017-01-16 (×12): via INTRAVENOUS_CENTRAL
  Filled 2017-01-13 (×17): qty 5000

## 2017-01-13 MED ORDER — METOPROLOL TARTRATE 5 MG/5ML IV SOLN
5.0000 mg | Freq: Once | INTRAVENOUS | Status: AC
  Administered 2017-01-13: 5 mg via INTRAVENOUS

## 2017-01-13 MED ORDER — NOREPINEPHRINE BITARTRATE 1 MG/ML IV SOLN
0.0000 ug/min | INTRAVENOUS | Status: DC
  Filled 2017-01-13: qty 4

## 2017-01-13 MED ORDER — METOPROLOL TARTRATE 5 MG/5ML IV SOLN
INTRAVENOUS | Status: AC
  Filled 2017-01-13: qty 5

## 2017-01-13 MED ORDER — IPRATROPIUM-ALBUTEROL 0.5-2.5 (3) MG/3ML IN SOLN
3.0000 mL | Freq: Four times a day (QID) | RESPIRATORY_TRACT | Status: DC
  Administered 2017-01-13 – 2017-01-16 (×13): 3 mL via RESPIRATORY_TRACT
  Filled 2017-01-13 (×15): qty 3

## 2017-01-13 MED ORDER — PANTOPRAZOLE SODIUM 40 MG IV SOLR
40.0000 mg | INTRAVENOUS | Status: DC
  Administered 2017-01-13 – 2017-01-16 (×4): 40 mg via INTRAVENOUS
  Filled 2017-01-13 (×3): qty 40

## 2017-01-13 MED ORDER — SODIUM CHLORIDE 0.9 % IV BOLUS (SEPSIS)
500.0000 mL | Freq: Once | INTRAVENOUS | Status: AC
  Administered 2017-01-13: 500 mL via INTRAVENOUS

## 2017-01-13 MED ORDER — NOREPINEPHRINE 16 MG/250ML-% IV SOLN
0.0000 ug/min | INTRAVENOUS | Status: DC
  Administered 2017-01-13: 40 ug/min via INTRAVENOUS
  Administered 2017-01-14: 36 ug/min via INTRAVENOUS
  Administered 2017-01-14: 35 ug/min via INTRAVENOUS
  Administered 2017-01-14: 38 ug/min via INTRAVENOUS
  Administered 2017-01-15: 40 ug/min via INTRAVENOUS
  Administered 2017-01-15 (×2): 60 ug/min via INTRAVENOUS
  Administered 2017-01-15: 45 ug/min via INTRAVENOUS
  Administered 2017-01-16: 68 ug/min via INTRAVENOUS
  Administered 2017-01-16: 75 ug/min via INTRAVENOUS
  Administered 2017-01-16: 70 ug/min via INTRAVENOUS
  Filled 2017-01-13 (×12): qty 250

## 2017-01-13 MED ORDER — HYDROCORTISONE NA SUCCINATE PF 100 MG IJ SOLR
50.0000 mg | Freq: Four times a day (QID) | INTRAMUSCULAR | Status: DC
  Administered 2017-01-13 – 2017-01-16 (×12): 50 mg via INTRAVENOUS
  Filled 2017-01-13 (×12): qty 2

## 2017-01-13 MED ORDER — PRISMASOL BGK 4/2.5 32-4-2.5 MEQ/L IV SOLN
INTRAVENOUS | Status: DC
  Administered 2017-01-14 – 2017-01-16 (×5): via INTRAVENOUS_CENTRAL
  Filled 2017-01-13 (×5): qty 5000

## 2017-01-13 MED ORDER — FUROSEMIDE 10 MG/ML IJ SOLN: 40.0000 mg | Freq: Once | INTRAMUSCULAR | Status: DC

## 2017-01-13 MED ORDER — NOREPINEPHRINE BITARTRATE 1 MG/ML IV SOLN: 0.0000 ug/min | INTRAVENOUS | Status: DC

## 2017-01-13 MED ORDER — VASOPRESSIN 20 UNIT/ML IV SOLN
0.0300 [IU]/min | INTRAVENOUS | Status: DC
  Administered 2017-01-14 – 2017-01-15 (×3): 0.03 [IU]/min via INTRAVENOUS
  Filled 2017-01-13 (×4): qty 2

## 2017-01-13 NOTE — Progress Notes (Addendum)
Call received from CCMD stating pt's HR was in the 150-160's. Upon assessment, pt A&O, denies CP, but noticed to be SOB. Sats were in the upper 80's on 3L. Pt placed on 4L. Lungs diminished. [facility] paged. Order received for 1x dose of 5mg  IV lopressor and a STAT CXR. HR currently in the low 100's. BP stable. CXR completed. Pt resting, no longer SOB, sats 98% on 4L.  Will continue to monitor and assess.  Viviano Simas, RN

## 2017-01-13 NOTE — Procedures (Signed)
Hemodialysis Catheter Insertion Procedure Note Louis Sutton [id number] 12/22/1931  Procedure: Insertion of Central Venous Catheter Indications: CRRT  Procedure Details Consent: Risks of procedure as well as the alternatives and risks of each were explained to the (patient/caregiver).  Consent for procedure obtained. Time Out: Verified patient identification, verified procedure, site/side was marked, verified correct patient position, special equipment/implants available, medications/allergies/relevent history reviewed, required imaging and test results available.  Performed  Maximum sterile technique was used including antiseptics, cap, gloves, gown, hand hygiene, mask and sheet. Skin prep: Chlorhexidine; local anesthetic administered  Attemoted to placed triple lumed HD cath in R IJ. Was able to get guidewire into R IJ, however, guidewire would not advance more than 5-6 cm. Visualized wire in vessel on ultrasound, but it would not thread.   Attempt aborted after 2 venipunctures with similar outcome.   PCXR ordered to r/o complication.   Will reattempt with a femoral approach after CXR results.  Joneen Roach, AGACNP-BC [place] [facility] Pager [phone] or [phone]  01/13/2017 8:29 PM  Alyson Reedy, M.D. [facility] Pulmonary/Critical Care Medicine. Pager: [phone]. After hours pager: [phone].

## 2017-01-13 NOTE — Progress Notes (Addendum)
Femoral HD cath placed.  Verbal orders to not get an xray for placement and to restart Heparin gtt in 2 hours. Will restart gtt at 0100 01/14/2017. Okay to start CRRT.

## 2017-01-13 NOTE — Progress Notes (Signed)
Presented to Patient room to place HD cath for CRRT. Patient stating that he does not want dialysis. Talked to Niece who is HCPOA - Will speak to Patient's daughters to make a decision on code status and further treatment goals.   Will hold off on HD Cath placement until spoke with them further.   Jovita Kussmaul, AG-ACNP [facility]  Pgr: [phone]  PCCM Pgr: [phone]

## 2017-01-13 NOTE — Assessment & Plan Note (Signed)
Usually hypertensive despite ESRD. Currently sBP 70s despite volume challenge. Febrile and possible sepsis  Plan Get echo Rule out MI Neo for MAP > 65

## 2017-01-13 NOTE — Significant Event (Signed)
Rapid Response Event Note  Overview:  Handoff from [place] RN night RRT - called for Hypotension and decreased LOC with Resp distress Time Called: 0645 Arrival Time: 0648 Event Type: Respiratory, Neurologic, Hypotension  Initial Focused Assessment:  Per Gunnar Fusi and RT Morrie Sheldon patient was difficult to arouse - some mild resp distress and desaturations on arrival.  Bil BS present - some diffuse fine crackles.  Positive JVD.  Staff reports patient has had slow decline in LOC during night.  Has had some afib with RVR and hypotension.  Hot to touch - rectal temp done 102. 4 - RR 28-32 BP 78/42 right arm -- correlates with right leg at 86/42.  Afib rate 110 - 132.   Patient is ESRD with HD scheduled for today.  RN reports 250 cc NS bolus x 2 for hypotension during night.  Patient is normally hypertensive per hx.  On NRB mask with O2 sats at 100%.     Interventions: ABG was done - results reviewed.  Placed on  Bipap for increased WOB see RT note.  CBG was checked - 66 - Dr. Jerral Ralph to bedside - updates given - D50W IV given per order - patient with limited IV access - MD aware. PCXR done showing persistent edema.  Patient tolerating Bipap - more awake after D50.  Critical care consulted - handoff to [facility] RN - to call as needed.    Plan of Care (if not transferred):  ICU transfer per Critical care on their arrival and exam.   Event Summary: Name of Physician Notified: Dr. Jerral Ralph at  (PTA RRT)  Name of Consulting Physician Notified: PCCM - Dr. Romualdo Bolk NP to bedside at (S) 0715 (by Dr. Jerral Ralph)  Outcome: Transferred (Comment) (2H20)  Event End Time: 1021  [place]

## 2017-01-13 NOTE — Progress Notes (Signed)
  PHARMACY - PHYSICIAN COMMUNICATION CRITICAL VALUE ALERT - BLOOD CULTURE IDENTIFICATION (BCID)  Results for orders placed or performed during the hospital encounter of 01/22/2017  Blood Culture ID Panel (Reflexed) (Collected: 01/13/2017  6:41 AM)  Result Value Ref Range   Enterococcus species NOT DETECTED NOT DETECTED   Listeria monocytogenes NOT DETECTED NOT DETECTED   Staphylococcus species NOT DETECTED NOT DETECTED   Staphylococcus aureus NOT DETECTED NOT DETECTED   Streptococcus species NOT DETECTED NOT DETECTED   Streptococcus agalactiae NOT DETECTED NOT DETECTED   Streptococcus pneumoniae NOT DETECTED NOT DETECTED   Streptococcus pyogenes NOT DETECTED NOT DETECTED   Acinetobacter baumannii NOT DETECTED NOT DETECTED   Enterobacteriaceae species DETECTED (A) NOT DETECTED   Enterobacter cloacae complex NOT DETECTED NOT DETECTED   Escherichia coli NOT DETECTED NOT DETECTED   Klebsiella oxytoca NOT DETECTED NOT DETECTED   Klebsiella pneumoniae DETECTED (A) NOT DETECTED   Proteus species NOT DETECTED NOT DETECTED   Serratia marcescens NOT DETECTED NOT DETECTED   Carbapenem resistance NOT DETECTED NOT DETECTED   Haemophilus influenzae NOT DETECTED NOT DETECTED   Neisseria meningitidis NOT DETECTED NOT DETECTED   Pseudomonas aeruginosa NOT DETECTED NOT DETECTED   Candida albicans NOT DETECTED NOT DETECTED   Candida glabrata NOT DETECTED NOT DETECTED   Candida krusei NOT DETECTED NOT DETECTED   Candida parapsilosis NOT DETECTED NOT DETECTED   Candida tropicalis NOT DETECTED NOT DETECTED    Name of physician (or Provider) Contacted: none  Changes to prescribed antibiotics required: none - patient currently treated with Zosyn which covers Kleb - f/u sensitivites and adjust ABX as indicated.  Leota Sauers Pharm.D. CPP, BCPS Clinical Pharmacist [phone] 01/13/2017 7:53 PM

## 2017-01-13 NOTE — Assessment & Plan Note (Signed)
Does not seem to be in aecopd  Plan Nebs and pulmicort

## 2017-01-13 NOTE — Progress Notes (Signed)
[facility] paged again for HR returning to the 150's-160's. Pt responsive but SOB. O2 sats dropping to the 80's again. O2 increased to 4.5L. Sats now 92%. Order received for NS bolus and 10mg  of IV cardizem.  Viviano Simas, RN

## 2017-01-13 NOTE — Progress Notes (Signed)
ANTICOAGULATION CONSULT NOTE - follow up Consult  Pharmacy Consult for heparin Indication: atrial fibrillation  No Known Allergies  Patient Measurements: Height: 5\' 9"  (175.3 cm) Weight: 160 lb 9.6 oz (72.8 kg) IBW/kg (Calculated) : 70.7  Vital Signs: Temp: 97.5 F (36.4 C) (03/28 2000) Temp Source: Oral (03/28 2000) BP: 87/19 (03/28 2100) Pulse Rate: 110 (03/28 2131)  Labs:  Recent Labs  12/23/2016 1144  01/13/17 0053 01/13/17 0641 01/13/17 1200 01/13/17 1735  HGB 9.1*  --  9.4*  --   --   --   HCT 28.7*  --  30.7*  --   --   --   PLT 163  --  154  --   --   --   APTT  --   --   --   --   --  128*  LABPROT 19.6*  --   --   --   --   --   INR 1.63  --   --   --   --   --   CREATININE 3.51*  --   --  4.41*  --   --   TROPONINI 0.03*  < > 0.06* 0.38* 1.34*  --   < > = values in this interval not displayed.  Estimated Creatinine Clearance: 12.9 mL/min (A) (by C-G formula based on SCr of 4.41 mg/dL (H)).   Medical History: Past Medical History:  Diagnosis Date  . Asthma   . Atrial fibrillation (HCC)   . CHF (congestive heart failure) (HCC)   . COPD (chronic obstructive pulmonary disease) (HCC)    2L home O2  . Diabetes mellitus without complication (HCC)   . Dyslipidemia   . Hypertension   . Hypocalcemia   . Leukocytosis   . Macrocytic anemia   . NSTEMI (non-ST elevated myocardial infarction) (HCC) 10/15/2016  . Renal disorder    ESRD - MWF HD  . Thrombocytopenia (HCC)     Medications:  Prescriptions Prior to Admission  Medication Sig Dispense Refill Last Dose  . acetaminophen (ACETAMINOPHEN 8 HOUR) 650 MG CR tablet Take 1,300 mg by mouth every 8 (eight) hours as needed for pain.     Marland Kitchen albuterol (PROVENTIL HFA;VENTOLIN HFA) 108 (90 Base) MCG/ACT inhaler Inhale 1 puff into the lungs every 6 (six) hours.     Marland Kitchen amLODipine (NORVASC) 10 MG tablet Take 1 tablet by mouth daily.   12/27/2016 at Unknown time  . CALCIUM CARBONATE PO Take 650 mg by mouth daily.     Marland Kitchen  ELIQUIS 2.5 MG TABS tablet Take 2 tablets by mouth 2 (two) times daily.   0 12/25/2016 at 800  . Folic Acid-Vit B6-Vit B12 (FOLBEE) 2.5-25-1 MG TABS tablet Take 1 tablet by mouth daily.     . insulin aspart (NOVOLOG) 100 UNIT/ML injection Inject 4 Units into the skin 3 (three) times daily before meals.     . Nutritional Supplements (NEPRO PO) Take 1 tablet by mouth daily.   12/20/2016 at Unknown time  . pantoprazole (PROTONIX) 40 MG tablet Take 40 mg by mouth 2 (two) times daily.   12/23/2016 at Unknown time  . sevelamer carbonate (RENVELA) 800 MG tablet Take 800 mg by mouth 3 (three) times daily with meals.   01/11/2017 at Unknown time  . aspirin 81 MG chewable tablet Chew 1 tablet by mouth daily.   Not Taking at Unknown time   Scheduled:  . atorvastatin  20 mg Oral q1800  . budesonide (PULMICORT) nebulizer solution  0.25 mg Nebulization BID  . chlorhexidine  15 mL Mouth Rinse BID  . Folic Acid-Vit B6-Vit B12  1 tablet Oral Daily  . hydrocortisone sodium succinate  50 mg Intravenous Q6H  . ipratropium-albuterol  3 mL Nebulization Q6H  . [START ON 01/14/2017] mouth rinse  15 mL Mouth Rinse q12n4p  . pantoprazole (PROTONIX) IV  40 mg Intravenous Q24H  . piperacillin-tazobactam (ZOSYN)  IV  3.375 g Intravenous Q12H  . sevelamer carbonate  800 mg Oral TID WC  . sodium chloride flush  3 mL Intravenous Q12H    Assessment: 83yo male has been on Eliquis for Afib now to transition to heparin; last dose of Eliquis 2100 3/27; Eliquis will affect anti-Xa levels so will manage w/ aPTT until Eliquis cleared. APTT 128 sec however heparin drip was turned on and off today for line placement/procedures.  Heparin drip currently off for central line placement and possible CRT.   No bleeding noted  Goal of Therapy:  Heparin level 0.3-0.7 units/ml aPTT 66-102 seconds Monitor platelets by anticoagulation protocol: Yes   Plan:  Follow up restart heparin - RN to call   Leota Sauers Pharm.D. CPP, BCPS Clinical  Pharmacist [phone] 01/13/2017 9:39 PM

## 2017-01-13 NOTE — Progress Notes (Signed)
PT Cancellation Note  Patient Details Name: St Kiessling MRN: [id number] DOB: April 16, 1932   Cancelled Treatment:    Reason Eval/Treat Not Completed: Medical issues which prohibited therapy. Pt remains on BiPAP and is being transferred to ICU. PT will continue to f/u with pt as available and appropriate.    Alessandra Bevels Marieli Rudy 01/13/2017, 10:34 AM

## 2017-01-13 NOTE — Progress Notes (Addendum)
PROGRESS NOTE        PATIENT DETAILS Name: Louis Sutton Age: 83 y.o. Sex: male Date of Birth: 01/22/1932 Admit Date: Jan 23, 2017 Admitting Physician Jonah Blue, MD PCP:[facility]  Brief Narrative: Patient is a 83 y.o. male hx of ESRD (MWF), atrial fibrillation on anticoagulation, DM-2, COPD on home O2 presented to the ED on 3/27 for evaluation of chest pain. Posthospitalization, patient developed intermittent episodes of atrial fibrillation with RVR, hypertension and worsening hypoxia. See below for further details.  Subjective: Informed by nurse around 7:00 am-patient hypoxic-on 100% rebreather max, hypotensive and tachycardic to the low 100s. On arrival to the patient's bedside, lethargic but arousable-in moderate amount of respiratory distress. He was given to 250 mL bolus with subsequent improvement in the blood pressure to the 90s systolic. Placed on BiPAP, and appears more comfortable.   Assessment/Plan: Hypotension with acute on chronic hypoxemic respiratory failure: Suspect sepsis as the etiology here, on exam does not appear to be volume overloaded-he does have a few rales but these appear disproportionate to the amount of hypoxia. Did have low-grade fever overnight, however has a rectal temperature of 102.9 this am. Etiology of sepsis not apparent-but could have HCAP Or bacteremia. Given 250 mL bolus 2, placed on BiPAP. Blood cultures have been drawn. Empiric antibiotics have been ordered. Chest x-ray pending, and the CCM consult and for ICU transfer.  A. fib with RVR: Start amiodarone infusion, and hopefully his blood pressure will improve once rate is better controlled. He apparently is maintained on Eliquis-with him being on HD-I will stop Eliquis and start him on a IV heparin infusion. Admitting M.D. has already consulted cardiology.  ESRD: On HD-MWF-due to hypotension-HD will be difficult. Nephrology has been already  consulted-from admission labs-no acute indication for HD at this time. On exam, although he is hypoxic, he only has some minimal rales at his bases.  Elevated troponins: Suspect demand ischemia in the setting of RVR and hypotension. Check echo, await further input from cardiology.   COPD with chronic hypoxia respiratory respiratory failure: Not wheezing-COPD currently appears stable-continue as needed bronchodilators. Titrated down to home oxygen when more stable.  Anemia: Suspect related to ESRD. Follow and transfuse when necessary.  DM-2: Continue SSI-follow CBGs.  Hypertension: Amlodipine on hold-due to hypotension. Resume when able.  GERD:c/w PPI  DVT Prophylaxis: Full dose anticoagulation with Heparin-stop eliquis  Code Status: Full code   Family Communication: None at bedside  Disposition Plan: Remain inpatient-suspect will require ICU transfer  Antimicrobial agents: Anti-infectives    None      Procedures: None  CONSULTS:  cardiology, pulmonary/intensive care and nephrology  Time spent: 60 minutes-Greater than 50% of this time was spent in counseling, explanation of diagnosis, planning of further management, and coordination of care.  The patient is critically ill with multiple organ system failure and requires high complexity decision making for assessment and support, frequent evaluation and titration of therapies, advanced monitoring, review of radiographic studies and interpretation of complex data.  MEDICATIONS: Scheduled Meds: . amiodarone  150 mg Intravenous Once  . atorvastatin  20 mg Oral q1800  . feeding supplement (NEPRO CARB STEADY)  237 mL Oral Daily  . Folic Acid-Vit B6-Vit B12  1 tablet Oral Daily  . insulin aspart  0-15 Units Subcutaneous TID WC  . insulin aspart  4 Units Subcutaneous TID  AC  . metoprolol      . pantoprazole  40 mg Oral BID  . sevelamer carbonate  800 mg Oral TID WC  . sodium chloride flush  3 mL Intravenous Q12H    Continuous Infusions: PRN Meds:.sodium chloride, acetaminophen, calcium carbonate (dosed in mg elemental calcium), camphor-menthol **AND** hydrOXYzine, docusate sodium, feeding supplement (NEPRO CARB STEADY), fentaNYL (SUBLIMAZE) injection, ondansetron (ZOFRAN) IV, sodium chloride flush, sorbitol, zolpidem   PHYSICAL EXAM: Vital signs: Vitals:   01/13/17 0539 01/13/17 0540 01/13/17 0554 01/13/17 0557  BP: (!) 64/39 (!) 68/44 (!) 80/33 (!) 85/35  Pulse: (!) 113 (!) 120 (!) 129 (!) 124  Resp: (!) 39 (!) 39 (!) 36 (!) 35  Temp:      TempSrc:      SpO2: 94% 93% 94% 96%  Weight:      Height:       Filed Weights   01/15/2017 1119 12/28/2016 1848  Weight: 77.6 kg (171 lb) 72.8 kg (160 lb 9.6 oz)   Body mass index is 23.72 kg/m.   General appearance :Awake but lethargic-in moderate distress.Looks very frail. Eyes:, pupils equally reactive to light and accomodation HEENT: Atraumatic and Normocephalic Neck: supple, no JVD. Resp:Good air entry bilaterally,rales bibasilar CVS: S1 S2 regular, tachycardic GI: Bowel sounds present, Non tender and not distended with no gaurding, rigidity or rebound. Extremities: B/L Lower Ext shows no edema, both legs are warm to touch. Fistula in left arm-no redness, discharge Neurology:  speech clear,Non focal, sensation is grossly intact. Musculoskeletal:No digital cyanosis Skin:No Rash, warm and dry Wounds:N/A  I have personally reviewed following labs and imaging studies  LABORATORY DATA: CBC:  Recent Labs Lab 01/15/2017 1144 01/13/17 0053  WBC 12.0* 6.2  NEUTROABS  --  5.6  HGB 9.1* 9.4*  HCT 28.7* 30.7*  MCV 104.4* 104.1*  PLT 163 154    Basic Metabolic Panel:  Recent Labs Lab 12/30/2016 1144  NA 133*  K 3.4*  CL 95*  CO2 28  GLUCOSE 171*  BUN 24*  CREATININE 3.51*  CALCIUM 9.0    GFR: Estimated Creatinine Clearance: 16.2 mL/min (A) (by C-G formula based on SCr of 3.51 mg/dL (H)).  Liver Function Tests:  Recent Labs Lab  12/29/2016 1144  AST 20  ALT 15*  ALKPHOS 99  BILITOT 1.1  PROT 7.4  ALBUMIN 3.2*    Recent Labs Lab 12/21/2016 1144  LIPASE 15   No results for input(s): AMMONIA in the last 168 hours.  Coagulation Profile:  Recent Labs Lab 12/30/2016 1144  INR 1.63    Cardiac Enzymes:  Recent Labs Lab 01/02/2017 1144 01/13/2017 1442 12/19/2016 1919 01/13/17 0053  TROPONINI 0.03* 0.05* 0.06* 0.06*    BNP (last 3 results) No results for input(s): PROBNP in the last 8760 hours.  HbA1C:  Recent Labs  12/30/2016 1919  HGBA1C 5.2    CBG:  Recent Labs Lab 12/26/2016 2231 01/13/17 0706  GLUCAP 161* 69    Lipid Profile:  Recent Labs  01/13/2017 2152  CHOL 152  HDL 56  LDLCALC 80  TRIG 79  CHOLHDL 2.7    Thyroid Function Tests: No results for input(s): TSH, T4TOTAL, FREET4, T3FREE, THYROIDAB in the last 72 hours.  Anemia Panel: No results for input(s): VITAMINB12, FOLATE, FERRITIN, TIBC, IRON, RETICCTPCT in the last 72 hours.  Urine analysis: No results found for: COLORURINE, APPEARANCEUR, LABSPEC, PHURINE, GLUCOSEU, HGBUR, BILIRUBINUR, KETONESUR, PROTEINUR, UROBILINOGEN, NITRITE, LEUKOCYTESUR  Sepsis Labs: Lactic Acid, Venous No results found for: LATICACIDVEN  MICROBIOLOGY:  No results found for this or any previous visit (from the past 240 hour(s)).  RADIOLOGY STUDIES/RESULTS: Dg Chest 2 View  Result Date: 19-Jan-2017 CLINICAL DATA:  Chest pain.  Dialysis patient EXAM: CHEST  2 VIEW COMPARISON:  None. FINDINGS: Mild cardiac enlargement.  Negative for edema. Small right pleural effusion. Right lower lobe airspace disease could represent atelectasis or infiltrate. Mild left lower lobe airspace disease and small left effusion. Apical pleural scarring bilaterally.  Left subclavian stent. Dilated colon compatible with adynamic ileus. Multiple displaced right rib fractures, possibly acute. IMPRESSION: Cardiac enlargement with mild vascular congestion Small bilateral effusions  and bibasilar atelectasis/infiltrate. Findings suggest fluid overload. Colonic ileus Multiple displaced right rib fractures, possibly acute Electronically Signed   By: Marlan Palau M.D.   On: 2017/01/19 12:34   Dg Chest Port 1 View  Result Date: 01/13/2017 CLINICAL DATA:  Shortness of breath tonight. Decreased oxygen saturation. EXAM: PORTABLE CHEST 1 VIEW COMPARISON:  Radiographs 01/19/17 FINDINGS: Progressive cardiomegaly. Atherosclerosis of the thoracic aorta. Increasing interstitial opacities suspicious for pulmonary edema. Increased bilateral pleural effusions. Worsening bibasilar opacities are likely atelectasis. No evidence of pneumothorax. Multiple right rib fractures again seen. IMPRESSION: Progressive cardiomegaly with increasing interstitial opacities suspicious for pulmonary edema. Increasing bilateral pleural effusions. Overall findings suggest fluid overload/CHF. Electronically Signed   By: Rubye Oaks M.D.   On: 01/13/2017 01:19     LOS: 1 day   Jeoffrey Massed, MD  [facility] Pager:[phone]  If 7PM-7AM, please contact night-coverage [email or web address] Password [facility] 01/13/2017, 7:23 AM

## 2017-01-13 NOTE — Progress Notes (Signed)
ANTICOAGULATION CONSULT NOTE - Initial Consult  Pharmacy Consult for heparin Indication: atrial fibrillation  No Known Allergies  Patient Measurements: Height: 5\' 9"  (175.3 cm) Weight: 160 lb 9.6 oz (72.8 kg) IBW/kg (Calculated) : 70.7  Vital Signs: Temp: 100.9 F (38.3 C) (03/28 0500) Temp Source: Axillary (03/28 0500) BP: 85/35 (03/28 0557) Pulse Rate: 124 (03/28 0557)  Labs:  Recent Labs  01/08/2017 1144 01/15/2017 1442 01/13/2017 1919 01/13/17 0053  HGB 9.1*  --   --  9.4*  HCT 28.7*  --   --  30.7*  PLT 163  --   --  154  LABPROT 19.6*  --   --   --   INR 1.63  --   --   --   CREATININE 3.51*  --   --   --   TROPONINI 0.03* 0.05* 0.06* 0.06*    Estimated Creatinine Clearance: 16.2 mL/min (A) (by C-G formula based on SCr of 3.51 mg/dL (H)).   Medical History: Past Medical History:  Diagnosis Date  . Asthma   . Atrial fibrillation (HCC)   . CHF (congestive heart failure) (HCC)   . COPD (chronic obstructive pulmonary disease) (HCC)    2L home O2  . Diabetes mellitus without complication (HCC)   . Dyslipidemia   . Hypertension   . Hypocalcemia   . Leukocytosis   . Macrocytic anemia   . NSTEMI (non-ST elevated myocardial infarction) (HCC) 10/15/2016  . Renal disorder    ESRD - MWF HD  . Thrombocytopenia (HCC)     Medications:  Prescriptions Prior to Admission  Medication Sig Dispense Refill Last Dose  . acetaminophen (ACETAMINOPHEN 8 HOUR) 650 MG CR tablet Take 1,300 mg by mouth every 8 (eight) hours as needed for pain.     Marland Kitchen albuterol (PROVENTIL HFA;VENTOLIN HFA) 108 (90 Base) MCG/ACT inhaler Inhale 1 puff into the lungs every 6 (six) hours.     Marland Kitchen amLODipine (NORVASC) 10 MG tablet Take 1 tablet by mouth daily.   12/18/2016 at Unknown time  . CALCIUM CARBONATE PO Take 650 mg by mouth daily.     Marland Kitchen ELIQUIS 2.5 MG TABS tablet Take 2 tablets by mouth 2 (two) times daily.   0 12/23/2016 at 800  . Folic Acid-Vit B6-Vit B12 (FOLBEE) 2.5-25-1 MG TABS tablet Take 1  tablet by mouth daily.     . insulin aspart (NOVOLOG) 100 UNIT/ML injection Inject 4 Units into the skin 3 (three) times daily before meals.     . Nutritional Supplements (NEPRO PO) Take 1 tablet by mouth daily.   01/04/2017 at Unknown time  . pantoprazole (PROTONIX) 40 MG tablet Take 40 mg by mouth 2 (two) times daily.   01/15/2017 at Unknown time  . sevelamer carbonate (RENVELA) 800 MG tablet Take 800 mg by mouth 3 (three) times daily with meals.   01/11/2017 at Unknown time  . aspirin 81 MG chewable tablet Chew 1 tablet by mouth daily.   Not Taking at Unknown time   Scheduled:  . amiodarone  150 mg Intravenous Once  . atorvastatin  20 mg Oral q1800  . feeding supplement (NEPRO CARB STEADY)  237 mL Oral Daily  . Folic Acid-Vit B6-Vit B12  1 tablet Oral Daily  . insulin aspart  0-15 Units Subcutaneous TID WC  . insulin aspart  4 Units Subcutaneous TID AC  . metoprolol      . pantoprazole  40 mg Oral BID  . sevelamer carbonate  800 mg Oral TID WC  .  sodium chloride flush  3 mL Intravenous Q12H    Assessment: 83yo male has been on Eliquis for Afib now to transition to heparin; last dose of Eliquis 2100 3/27; Eliquis will affect anti-Xa levels so will manage w/ aPTT until Eliquis cleared.  Goal of Therapy:  Heparin level 0.3-0.7 units/ml aPTT 66-102 seconds Monitor platelets by anticoagulation protocol: Yes   Plan:  Will get baseline coags now then at 0900 this am will begin heparin gtt at 1100 units/hr and monitor heparin levels, aPTT, and CBC.  Vernard Gambles, PharmD, BCPS  01/13/2017,7:23 AM

## 2017-01-13 NOTE — Progress Notes (Signed)
Initial Nutrition Assessment  DOCUMENTATION CODES:   Non-severe (moderate) malnutrition in context of chronic illness  INTERVENTION:  -Recommend addition of nutritional supplement once pt able to take po (Nepro vs Ensure based on renal labs) -Recommend holding renvela while NPO -Recommend addition of renal MVI  NUTRITION DIAGNOSIS:   Malnutrition related to chronic illness as evidenced by percent weight loss, moderate depletions of muscle mass, mild depletion of muscle mass.  GOAL:   Patient will meet greater than or equal to 90% of their needs  MONITOR:   Diet advancement, Labs, Weight trends  REASON FOR ASSESSMENT:   Malnutrition Screening Tool, Consult Assessment of nutrition requirement/status  ASSESSMENT:   83 yo male admitted with chest pain/elevated troponin; hx of ESRD on HD, HTN, HLD, DM, COPD, NSTEMI, CHF  3/28 Pt developed acute respiratory distress with acute pulmonary edema and AECOPD, HCAP, requiring BiPap; pt febrile. Severe sepsis. Transfer to ICU  Pt lethargic but arouseable on visit today, falls asleep easily during exam, on Bipap Recorded po intake 0% at breakfast this AM, pt now NPO due to respiratory status Pt reports good appetite prior to admission, eating 3 meals per day and taking a nutritional supplement (ensure/boost). Good history difficult to obtain as pt continued to fall asleep during exam Per MD note, pt with decreased appetite and unintentional wt loss of 40 pound since December (20% wt loss in 3 months which is significant for time frame). No previous weight encounters  Nutrition-Focused physical exam completed. Findings are no fat depletion, mild/moderate muscle depletion, and no edema. Pt did have significant areas of loose skin on exam signifying probable wt loss  Prescribed marinol as outpatient but could not afford, currently ordered megace Pt started on D5 this AM due to hypoglycemia  Labs: CBGs 46-91, Creatinine 4.41  (worsening) Meds: D5 at 50 ml/hr, renvela, calcium carbonate prn  Diet Order:  Diet NPO time specified  Skin:  Reviewed, no issues  Last BM:  no documented BM  Height:   Ht Readings from Last 1 Encounters:  12/24/2016 5\' 9"  (1.753 m)    Weight:   Wt Readings from Last 1 Encounters:  01/01/2017 160 lb 9.6 oz (72.8 kg)    Filed Weights   12/31/2016 1119 01/06/2017 1848  Weight: 171 lb (77.6 kg) 160 lb 9.6 oz (72.8 kg)    BMI:  Body mass index is 23.72 kg/m.  Estimated Nutritional Needs:   Kcal:  1900-2300 kcals  Protein:  95-115 g  Fluid:  1000 mL plus UOP  EDUCATION NEEDS:   No education needs identified at this time  Romelle Starcher MS, RD, LDN [phone] Pager  [phone] Weekend/On-Call Pager

## 2017-01-13 NOTE — Progress Notes (Signed)
Patient was taken off BIPAP during hemodialysis cath procedure and placed on 5 LPM nasal cannula. Patient tolerated being off BIPAP during procedure. SPO2 99% and patient is not in distress. Speaking full sentences. RT will continue to monitor.

## 2017-01-13 NOTE — Assessment & Plan Note (Signed)
Febrile 103F. (defc 2017 had e coill bacteremia in [place]). Ver high prob for severe sepsis  Plan Broad antibioptics

## 2017-01-13 NOTE — Consult Note (Signed)
PULMONARY / CRITICAL CARE MEDICINE   Name: Louis Sutton MRN: [id number] DOB: [birth year]    ADMISSION DATE:  12/24/2016 CONSULTATION DATE: 01/13/2017  REFERRING MD:  Dr. Jerral Ralph   CHIEF COMPLAINT:  Chest Pain/Dyspnea   HISTORY OF PRESENT ILLNESS:   83 year old male with PMH of Afib, Asthma, CHF, COPD (On 2L [place]), DM, HTN, Anemia, NSTEMI, ESRD on HD MWF and recent RUE DVT on Eliquis. Presents to ED on 3/27 with reported progressive dyspnea with abdominal and chest pain. Patient was admitted to [facility] on 3/23 and then discharged 3/27 with reported symptoms reoccurring after 6 hours of discharge. Upon arrival to ED patient was alert and oriented complaining of active chest pain - flat troponins, with BP 90-100s Systolic and HR 110-120 in Afib. Admitted to Step-down. Overnight on 3/27 patient went into Afib with RVR, treated with Cardizem and Metoprolol. This morning systolic 60-80 in Afib with HR 110-130, and hypoxic requiring BIPAP. PCCM asked to consult.   Patient had a recent hospital stay from 12/29-1/13 for sepsis secondary to E. Coli Bacteriemia. During this stay patient was found to have an adrenal nodule and RUE DVT in which was treated with Eliquis. After discharge was sent to Rehab and then ultimately assisted living.   PAST MEDICAL HISTORY :  He  has a past medical history of Asthma; Atrial fibrillation (HCC); CHF (congestive heart failure) (HCC); COPD (chronic obstructive pulmonary disease) (HCC); Diabetes mellitus without complication (HCC); Dyslipidemia; Hypertension; Hypocalcemia; Leukocytosis; Macrocytic anemia; NSTEMI (non-ST elevated myocardial infarction) (HCC) (10/15/2016); Renal disorder; and Thrombocytopenia (HCC).  PAST SURGICAL HISTORY: He  has no past surgical history on file.  No Known Allergies  No current facility-administered medications on file prior to encounter.    No current outpatient prescriptions on file prior to encounter.    FAMILY HISTORY:   His has no family status information on file.    SOCIAL HISTORY: He  reports that he has quit smoking. He has a 5.00 pack-year smoking history. He has never used smokeless tobacco. He reports that he does not drink alcohol or use drugs.  REVIEW OF SYSTEMS:   Unable to review as patient is lethargic.   SUBJECTIVE:  On Bipap and amiodarone gtt. Febrile this AM (temp 102.5)  VITAL SIGNS: BP (!) 85/35   Pulse (!) 124   Temp (!) 102.5 F (39.2 C) (Rectal)   Resp 14   Ht 5\' 9"  (1.753 m)   Wt 72.8 kg (160 lb 9.6 oz)   SpO2 96%   BMI 23.72 kg/m   HEMODYNAMICS:    VENTILATOR SETTINGS: FiO2 (%):  [60 %] 60 %  INTAKE / OUTPUT: I/O last 3 completed shifts: In: 100 [P.O.:100] Out: -   PHYSICAL EXAMINATION: General:  Adult male, no distress  Neuro:  Lethargic, follows commands, moves all extremities  HEENT:  Normocephalic  Cardiovascular:  Afib, Tachy, NI S1/S2 Lungs:  Rhonchi throughout, no crackles/wheeze, non-labored  Abdomen:  Active bowel sound, non-tender, distended  Musculoskeletal:  No acute, no edema  Skin:  Warm, dry, intact   LABS:  BMET  Recent Labs Lab 12/19/2016 1144 01/13/17 0641  NA 133* 135  K 3.4* 3.6  CL 95* 97*  CO2 28 25  BUN 24* 34*  CREATININE 3.51* 4.41*  GLUCOSE 171* 75    Electrolytes  Recent Labs Lab 01/05/2017 1144 01/13/17 0641  CALCIUM 9.0 8.8*    CBC  Recent Labs Lab 01/06/2017 1144 01/13/17 0053  WBC 12.0* 6.2  HGB 9.1* 9.4*  HCT 28.7* 30.7*  PLT 163 154    Coag's  Recent Labs Lab 01-24-2017 1144  INR 1.63    Sepsis Markers No results for input(s): LATICACIDVEN, PROCALCITON, O2SATVEN in the last 168 hours.  ABG  Recent Labs Lab 01/13/17 0656  PHART 7.439  PCO2ART 36.2  PO2ART 220*    Liver Enzymes  Recent Labs Lab 2017-01-24 1144  AST 20  ALT 15*  ALKPHOS 99  BILITOT 1.1  ALBUMIN 3.2*    Cardiac Enzymes  Recent Labs Lab 01-24-2017 1919 01/13/17 0053 01/13/17 0641  TROPONINI 0.06* 0.06*  0.38*    Glucose  Recent Labs Lab 2017-01-24 2231 01/13/17 0706 01/13/17 0744 01/13/17 0748 01/13/17 0801  GLUCAP 161* 69 46* 66 56*    Imaging Dg Chest 2 View  Result Date: 01/24/17 CLINICAL DATA:  Chest pain.  Dialysis patient EXAM: CHEST  2 VIEW COMPARISON:  None. FINDINGS: Mild cardiac enlargement.  Negative for edema. Small right pleural effusion. Right lower lobe airspace disease could represent atelectasis or infiltrate. Mild left lower lobe airspace disease and small left effusion. Apical pleural scarring bilaterally.  Left subclavian stent. Dilated colon compatible with adynamic ileus. Multiple displaced right rib fractures, possibly acute. IMPRESSION: Cardiac enlargement with mild vascular congestion Small bilateral effusions and bibasilar atelectasis/infiltrate. Findings suggest fluid overload. Colonic ileus Multiple displaced right rib fractures, possibly acute Electronically Signed   By: Marlan Palau M.D.   On: 24-Jan-2017 12:34   Dg Chest Port 1 View  Result Date: 01/13/2017 CLINICAL DATA:  Shortness of breath tonight. Decreased oxygen saturation. EXAM: PORTABLE CHEST 1 VIEW COMPARISON:  Radiographs 24-Jan-2017 FINDINGS: Progressive cardiomegaly. Atherosclerosis of the thoracic aorta. Increasing interstitial opacities suspicious for pulmonary edema. Increased bilateral pleural effusions. Worsening bibasilar opacities are likely atelectasis. No evidence of pneumothorax. Multiple right rib fractures again seen. IMPRESSION: Progressive cardiomegaly with increasing interstitial opacities suspicious for pulmonary edema. Increasing bilateral pleural effusions. Overall findings suggest fluid overload/CHF. Electronically Signed   By: Rubye Oaks M.D.   On: 01/13/2017 01:19   Dg Chest Port 1v Same Day  Result Date: 01/13/2017 CLINICAL DATA:  Shortness of breath. Hypertension. COPD. Congestive heart failure. EXAM: PORTABLE CHEST 1 VIEW COMPARISON:  Earlier same day FINDINGS:  Similar appearance. Cardiomegaly. Aortic atherosclerosis. Interstitial and early alveolar pulmonary edema. Small pleural effusions/pleural scarring right more than left. Right-sided rib fractures as seen previously, age indeterminate. IMPRESSION: No change. Cardiomegaly. Interstitial and early alveolar edema. Effusion/pleural scarring right more than left. Rib fractures on the right, age indeterminate. Electronically Signed   By: Paulina Fusi M.D.   On: 01/13/2017 07:40     STUDIES:  CXR 3/27 > small right pleural effusion, right lower lobe airspace diease, mild left lower lobe airspace disease and small left effusion, multiple displaced right rib fractures.  CXR 3/28 > Cardiomegaly, aortic atherosclerosis, interstitial and early alveolar pulmonary edema, small pleural effusions/pleural scarring right more then left, right-sided rib fractures ECHO 3/28>  CULTURES: Blood 3/28 >   ANTIBIOTICS: Cefepime 3/27 > 3/28 Vancomycin 3/27 > Zosyn 3/28 >   SIGNIFICANT EVENTS: 3/27 > Presented to ED dyspnea/chest pain   LINES/TUBES:   DISCUSSION 83 year old with recent hospital stay for chest pain/abdominal pain/dyspnea presents to ED on 3/27 for recurrence of symptoms. Went into Afib with RVR with hypotension (systolic 60-80). Cardiology consulted. Required BIPAP for hypoxia and transfer to ICU on 3/28.   ASSESSMENT / PLAN:  PULMONARY A: Acute on Chronic Hypoxic Respiratory Failure PE vs HCAP vs Volume Overload vs  Splinting secondary to Rib FX -CXR revealed right sided rib fractures (unknown sure if new or old) P:   BIPAP as needed Maintain Oxygenation >90 CTA Chest rule out PE  Pulmonary Hygiene  Scheduled Duoneb and Pulmicort  Albuterol PRN   CARDIOVASCULAR A:  Afib with RVR Hypotension  Recent DVT on Eliquis  H/O HTN, HLD P:  Cardiology consulted  Cardiac Monitoring  Continue Ammo Gtt Continue Heparin GTT Bolus 500 cc now  Hold home Norvasc  Maintain MAP >65   RENAL A:    ESRD on HD MWF  P:   Nephrology consulted  Trend BMP Replace electrolytes as needed  GASTROINTESTINAL A:   H/O Cirrhosis  P:   PPI NPO Trend LFT and Ammonia   HEMATOLOGIC A:   Anemia  H/O Recent DVT on eliquis  P:  Trend CBC Transfuse for Hbg <7  INFECTIOUS A:   Sepsis from unknown etiology with recent E.Coli Bacteremia  P:   Follow culture data Vancomycin and Zosyn  Trend WBC and fever curve  Trend lactic acid and Procal   ENDOCRINE A:   Hypoglycemia  P:   D5 @ 50 ml/hr  Q4H glucose checks  Hold SSI  NEUROLOGIC A:   Encephalopathic  P:   RASS goal: 0/-1 Hold sedating medications    FAMILY  - Updates: no family at bedside   - Inter-disciplinary family meet or Palliative Care meeting due by:  4/4  CC Time: 45 minutes   Jovita Kussmaul, AG-ACNP [facility]  Pgr: [phone]  PCCM Pgr: [phone]

## 2017-01-13 NOTE — Progress Notes (Signed)
CRITICAL VALUE ALERT  Critical value received:  1400  Date of notification:  01/13/17  Time of notification:  1403  Critical value read back:yes  Nurse who received alert:  Hoover Browns    MD notified (1st page): [phone]  Time of first page:  1403  MD notified (2nd page):  Time of second page:  Responding MD: Alvis Lemmings   Time MD responded:  [phone]

## 2017-01-13 NOTE — Care Management Note (Signed)
Case Management Note  Patient Details  Name: Louis Sutton MRN: [id number] Date of Birth: 27-Sep-1932  Subjective/Objective:                 Patient from [facility], non ambulatory since December, CP, hypotensive requiring Aline 3/28.    Action/Plan:   Expected Discharge Date:                  Expected Discharge Plan:     In-House Referral:     Discharge planning Services  CM Consult  Post Acute Care Choice:    Choice offered to:     DME Arranged:    DME Agency:     HH Arranged:    HH Agency:     Status of Service:  In process, will continue to follow  If discussed at Long Length of Stay Meetings, dates discussed:    Additional Comments:  Lawerance Sabal, RN 01/13/2017, 3:35 PM

## 2017-01-13 NOTE — Progress Notes (Addendum)
[facility] paged again for hypotension after administering IV cardizem. HR now in the 120's. BP 64/39 (46). Order received to bolus another 250cc. Viviano Simas, RN

## 2017-01-13 NOTE — Progress Notes (Signed)
Patient transported to CT and back to room 2H20, on Bipap, without complications.

## 2017-01-13 NOTE — Consult Note (Signed)
Cardiology Consult    Patient ID: Abie Cheek MRN: [id number], DOB/AGE: 1932-06-11   Admit date: Jan 22, 2017 Date of Consult: 01/13/2017  Primary Physician: [facility] Reason for Consult: Chest Pain, Elevated Troponin Primary Cardiologist: New to [facility]; Followed by Dr. Rockne Menghini in [place], [place] Requesting Provider: Dr. Jerral Ralph  History of Present Illness    Sederick Jacobsen is a 83 y.o. male with past medical history of COPD (on 2L [place]), ESRD (on MWF), HTN, HLD, Type 2 DM, and chronic atrial fibrillation (on Eliquis) who presented to [facility] on 01-22-2017 for evaluation of chest pain.   Previously admitted in 09/2016 at [facility] for E.coli bacteremia and acute DVT. Continued on Eliquis at that time. Noted to have an NSTEMI with peak troponin of 1.76 by review of Care Everywhere. Echocardiogram was obtained and showed a preserved EF of 55-60%, mild MR, and moderate TR. He was initially discharged to SNF but has transitioned to Assisted Living.   Reports being admitted at [facility] over the past weekend for chest pain, nausea, and vomiting thought to be secondary to fluid overload. BNP was elevated at 3526 and he received Lasix with improvement in his symptoms. Was discharged yesterday morning but developed recurrent chest pain which prompted him to go to the ED for evaluation.   While in the ED, initial labs showed WBC 12.0, Hgb 9.1, platelets 163. Na+ 133, K+ 3.4, creatinine 3.51. Lipase 15. Initial troponin 0.03 with repeat values of 0.05, 0.06, and 0.38. BNP 2371. Blood cultures and d-dimer pending. CXR showing cardiomegaly with interstitial and early alveolar edema with rib fractures along the right. CTA pending. EKG showing atrial fibrillation with RVR, HR 127. He was transferred to [facility] for further evaluation.   Overnight, he went into atrial fibrillation with RVR, HR in the 150's - 160's. O2 saturations were in the 80's, therefore  his [place] was increased from 3L to 4L. Was given IV Lopressor, IV Cardizem, and 250 mL normal saline. He became hypotensive after receiving IV Cardizem with BP readings at 64/39. He has since been switched to IV Amiodarone and Heparin.   He has received more IVF. Was febrile up to 102.5. Was started on Cefepime and Vancomycin for PNA. Systolic readings remain in the 70's.   He is currently on BiPAP but able to respond to questions. He denies any chest pain since arrival to [facility]. Says he sees a Development worker, international aid in [place] and had a stress test two weeks ago which was "normal". Denies any prior history of cardiac catheterizations or stent placement.   Past Medical History   Past Medical History:  Diagnosis Date  . Asthma   . Atrial fibrillation (HCC)   . CHF (congestive heart failure) (HCC)   . COPD (chronic obstructive pulmonary disease) (HCC)    2L home O2  . Diabetes mellitus without complication (HCC)   . Dyslipidemia   . Hypertension   . Hypocalcemia   . Leukocytosis   . Macrocytic anemia   . NSTEMI (non-ST elevated myocardial infarction) (HCC) 10/15/2016  . Renal disorder    ESRD - MWF HD  . Thrombocytopenia (HCC)     History reviewed. No pertinent surgical history.   Allergies  No Known Allergies  Inpatient Medications    . atorvastatin  20 mg Oral q1800  . Folic Acid-Vit B6-Vit B12  1 tablet Oral Daily  . metoprolol      . pantoprazole (PROTONIX) IV  40 mg  Intravenous Q24H  . piperacillin-tazobactam (ZOSYN)  IV  3.375 g Intravenous Q12H  . sevelamer carbonate  800 mg Oral TID WC  . sodium chloride  500 mL Intravenous Once  . sodium chloride flush  3 mL Intravenous Q12H  . vancomycin  1,500 mg Intravenous Once    Family History    Family History  Problem Relation Age of Onset  . Diabetes Father     Social History    Social History   Social History  . Marital status: Widowed    Spouse name: N/A  . Number of children: N/A  . Years of education: N/A    Occupational History  . Not on file.   Social History Main Topics  . Smoking status: Former Smoker    Packs/day: 0.50    Years: 10.00  . Smokeless tobacco: Never Used     Comment: x 40 years ago  . Alcohol use No  . Drug use: No  . Sexual activity: Not on file   Other Topics Concern  . Not on file   Social History Narrative  . No narrative on file     Review of Systems    General:  No chills, fever, night sweats or weight changes.  Cardiovascular:  No edema, orthopnea, palpitations, paroxysmal nocturnal dyspnea. Positive for chest pain and dyspnea on exertion.  Dermatological: No rash, lesions/masses Respiratory: No cough, Positive for dyspnea Urologic: No hematuria, dysuria Abdominal:   No nausea, vomiting, diarrhea, bright red blood per rectum, melena, or hematemesis Neurologic:  No visual changes, wkns, changes in mental status. All other systems reviewed and are otherwise negative except as noted above.  Physical Exam    Blood pressure (!) 85/35, pulse (!) 124, temperature (!) 102.5 F (39.2 C), temperature source Rectal, resp. rate 14, height 5\' 9"  (1.753 m), weight 160 lb 9.6 oz (72.8 kg), SpO2 96 %.  General: Pleasant, African American male, currently on BiPAP.  Psych: Normal affect. Neuro: Alert and oriented X 3. Moves all extremities spontaneously. Answers questions appropriately but quickly falls back asleep.  HEENT: Normal  Neck: Supple without bruits or JVD. Lungs:  Resp regular and unlabored, rhonchi throughout lung fields, wheezing along upper lung fields bilaterally. Heart: Irregularly irregular, no s3, s4, or murmurs. Abdomen: Soft, non-tender, non-distended, BS + x 4.  Extremities: No clubbing, cyanosis or edema. DP/PT/Radials 2+ and equal bilaterally.  Labs    Troponin (Point of Care Test) No results for input(s): TROPIPOC in the last 72 hours.  Recent Labs  01/06/2017 1442 12/19/2016 1919 01/13/17 0053 01/13/17 0641  TROPONINI 0.05* 0.06*  0.06* 0.38*   Lab Results  Component Value Date   WBC 6.2 01/13/2017   HGB 9.4 (L) 01/13/2017   HCT 30.7 (L) 01/13/2017   MCV 104.1 (H) 01/13/2017   PLT 154 01/13/2017     Recent Labs Lab 01/12/2017 1144 01/13/17 0641  NA 133* 135  K 3.4* 3.6  CL 95* 97*  CO2 28 25  BUN 24* 34*  CREATININE 3.51* 4.41*  CALCIUM 9.0 8.8*  PROT 7.4  --   BILITOT 1.1  --   ALKPHOS 99  --   ALT 15*  --   AST 20  --   GLUCOSE 171* 75   Lab Results  Component Value Date   CHOL 152 01/04/2017   HDL 56 January 16, 2017   LDLCALC 80 Jan 16, 2017   TRIG 79 Jan 16, 2017   No results found for: [facility]   Radiology Studies    Dg Chest  2 View  Result Date: 02/07/17 CLINICAL DATA:  Chest pain.  Dialysis patient EXAM: CHEST  2 VIEW COMPARISON:  None. FINDINGS: Mild cardiac enlargement.  Negative for edema. Small right pleural effusion. Right lower lobe airspace disease could represent atelectasis or infiltrate. Mild left lower lobe airspace disease and small left effusion. Apical pleural scarring bilaterally.  Left subclavian stent. Dilated colon compatible with adynamic ileus. Multiple displaced right rib fractures, possibly acute. IMPRESSION: Cardiac enlargement with mild vascular congestion Small bilateral effusions and bibasilar atelectasis/infiltrate. Findings suggest fluid overload. Colonic ileus Multiple displaced right rib fractures, possibly acute Electronically Signed   By: Marlan Palau M.D.   On: 02/07/17 12:34   Dg Chest Port 1 View  Result Date: 01/13/2017 CLINICAL DATA:  Shortness of breath tonight. Decreased oxygen saturation. EXAM: PORTABLE CHEST 1 VIEW COMPARISON:  Radiographs Feb 07, 2017 FINDINGS: Progressive cardiomegaly. Atherosclerosis of the thoracic aorta. Increasing interstitial opacities suspicious for pulmonary edema. Increased bilateral pleural effusions. Worsening bibasilar opacities are likely atelectasis. No evidence of pneumothorax. Multiple right rib fractures again seen.  IMPRESSION: Progressive cardiomegaly with increasing interstitial opacities suspicious for pulmonary edema. Increasing bilateral pleural effusions. Overall findings suggest fluid overload/CHF. Electronically Signed   By: Rubye Oaks M.D.   On: 01/13/2017 01:19   Dg Chest Port 1v Same Day  Result Date: 01/13/2017 CLINICAL DATA:  Shortness of breath. Hypertension. COPD. Congestive heart failure. EXAM: PORTABLE CHEST 1 VIEW COMPARISON:  Earlier same day FINDINGS: Similar appearance. Cardiomegaly. Aortic atherosclerosis. Interstitial and early alveolar pulmonary edema. Small pleural effusions/pleural scarring right more than left. Right-sided rib fractures as seen previously, age indeterminate. IMPRESSION: No change. Cardiomegaly. Interstitial and early alveolar edema. Effusion/pleural scarring right more than left. Rib fractures on the right, age indeterminate. Electronically Signed   By: Paulina Fusi M.D.   On: 01/13/2017 07:40    EKG & Cardiac Imaging    EKG: Atrial fibrillation with RVR, HR 127. - Personally Reviewed  Echocardiogram: 10/16/2016 Summary  1. Overall left ventricular ejection fraction is estimated at 55 to 60%.  2. Moderately enlarged right ventricle.  3. Right ventricular volume and pressure overload.  4. Mildly reduced RV systolic function.  5. Moderately dilated left atrium.  6. Severely dilated right atrium.  7. Mild mitral annular calcification.  8. Mild mitral valve regurgitation.  9. Moderate tricuspid regurgitation. 10. Mildly elevated pulmonary artery systolic pressure. 11. Diastolic dysfunction-unable to grade. 12. Elevated left ventricular end diastolic pressure by tissue Doppler.  Assessment & Plan    1. Chest Pain/Elevated Troponin - recently admitted at [facility] for chest pain. CXR shows possible rib fractures.  - denies any prior known history of CAD or MI's. Was admitted in 09/2016 for E.coli bacteremia and had a Type 2 NSTEMI with peak  troponin of 1.76 by review of Care Everywhere. Echocardiogram showed a preserved EF of 55-60%, mild MR, and moderate TR. Patient reports having a "normal" stress test just two weeks ago.  - troponin values have been 0.03, 0.05, 0.06, and 0.38. Continue to trend.  - repeat echocardiogram is pending to assess LV function and wall motion. Would not pursue further ischemic testing at this time in the setting of his acute illness. Will obtain records from his Primary Cardiologist in [place], [place].   2. Atrial Fibrillation with RVR - has chronic atrial fibrillation by review of records.  - This patients CHA2DS2-VASc Score and unadjusted Ischemic Stroke Rate (% per year) is equal to 9.7 % stroke rate/year from a score of 6 (HTN,  DM, Age (2), TE(2)). On Eliquis prior to admission. Has been switched to IV Heparin.  - became hypotensive with IV Lopressor and IV Cardizem. Has been switched to IV Amiodarone. Only PTA cardiac medications include ASA, Eliquis, and Amlodipine. Would not prefer Amiodarone in the long-term setting with his known lung disease.   3. Acute Hypoxic Respiratory Failure/ COPD - on 2L [place] at home. O2 saturations in the 80's overnight and now requiring BiPAP.  - CTA pending to rule out PE. Has been on Eliquis since at least 09/2016 due to an acute DVT at that time.   4. Sepsis - febrile up to 102.5 this AM. Has been started on Cefepime and Vancomycin - Critical Care following.   5. ESRD - on HD MWF.  - Nephrology has been consulted by the admitting team.     Signed, Ellsworth Lennox, PA-C 01/13/2017, 9:05 AM Pager: [phone] Patient seen and examined and history reviewed. Agree with above findings and plan. Patient is an 83 yo BM seen at the request of Dr. Jerral Ralph for evaluation of chest pain and abnormal troponin. He has a history of ESRD, COPD. Was admitted in December with E coli bacteremia and demand ischemia with elevated troponin. Echo at that time showed normal LV  function. He does have chronic Afib. Admitted over this past weekend with chest pain and volume overload. Was just DC yesterday but returned to ED at [facility] with recurrent chest pain and was in AFib with RVR. Chest pain has resolved. Transferred here and was profoundly hypotensive, febrile with temp 102.5, and AFb rate increased. Troponin again mildly elevated- peak 0.38 so far. Developed acute respiratory failure requiring Bipap.   On exam he is a pleasant BM alert on Bipap. No JVD Lungs with scattered rhonchi and wheezes CV IRR without gallop or murmur.  Abd soft and NT.  No LE edema  Impression:  1. Chest pain and elevated troponin. Patient reports a stress test in [place] 2 weeks ago that was normal. Will attempt to get report. Most likely this is demand ischemia due to sepsis, respiratory failure and ESRD. Will anticoagulate  with IV heparin. ASA. Not a candidate for antianginal therapy due to hypotension. Will check Echo 2. Atrial fibrillation with RVR. He has chronic AFib. Elevated rate due to multiple stressors of sepsis, fever, hypotension, acute respiratory failure. Given hypotension will load with IV amiodarone to help control rate until other underlying medical issues can be addressed. This is probably not a good choice for long term therapy due to underlying lung disease. Hopefully could use beta blocker once hypotension resolved. Apparently was on Eliquis before- this is not advisable with ESRD. Would consider coumadin for anticoagulation once able to take po's and no invasive procedures planned.  3. Acute on chronic respiratory failure/ COPD. On Bipap. Per CCM. 4. Acute sepsis with shock 5. ESRD on dialysis 6. History of DVT. Plan CT chest per CCM.  The patient is critically ill with multiple organ systems failure and requires high complexity decision making for assessment and support, frequent evaluation and titration of therapies, application of advanced monitoring technologies  and extensive interpretation of multiple databases.   Peter [place], MDFACC 01/13/2017 9:57 AM

## 2017-01-13 NOTE — Assessment & Plan Note (Addendum)
Known COPD on 02Admitted 01-29-2017 with resp complaints. This 01/13/2017 AM deterioration in respiratory distress with acute hypoxemic resp failure needing bipap in setting of a fib RVR, temp 103F and right rib fracture that are possibly acute on cxr  Ddx - acute pulm edema. AECOPD (hthough not wheeze(, HCAP, chest spliting with atx . Possible PE given recent admits etc.   Plan bippap Keep pusle ox > 88% Check abg Intubate if worse CTA rule out PE Check ECHO Rule out MI

## 2017-01-13 NOTE — Progress Notes (Signed)
Called RRT to come evaluate pt after LOC started to decrease and O2 sats dropped to 60's on 4.5L. BP hypotensive; systolic in the 80s. CBG 69. Placed pt on NRB mask. RRT and Dr. Jerral Ralph at bedside. ABG obtained. See additional new orders. CBG corrected with amp of D50. RT placed pt on BiPAP. Pt more alert now. Pt's care has now been handed off to next RN for continual care.  Viviano Simas, RN

## 2017-01-13 NOTE — Consult Note (Signed)
[facility] Consultation Note Requesting Physician:  CCM Primary Nephrologist: Dr. Jonetta Speak, [facility] [place] Reason for Consult:  Provision of dialysis and other ESRD related services  HPI: The patient is a 83 y.o. year-old gentleman with ESRD on MWF HD at [facility]. Has other probs of COPD, AF (Eliquis), DVT, Recent (09/2016) EColi bacteremia/NSTEMI. Resides in [place] [facility]. Was recently admitted to [facility] with CP/N/V felt 2/2 fluid overload? Was discharged, then returned with recurrent CP and had CXR w/interstitial and alveolar edema, R rib fractures, was in AF with RVR. Got transferred to [facility], overnight last P had AF with RVR, dropped sats into the 80's, became hypotensive, spiked a fever to 102.5 and ATB's started. Procalcitonin elevated and lactate to 6.3. DDimer also elevated. Over the course of today has had volume resuscitation for hypotension, is now on high dose pressors, steroids, vanco, zosyn and has required placement of a central line and an a-line. Today is usual HD day. Pt too unstable for HD. No issues so far with K.  Getting LARGE obligatory fluid intake from pressors/ATB's/etc.  Past Medical History:  Diagnosis Date  . Asthma   . Atrial fibrillation (HCC)   . CHF (congestive heart failure) (HCC)   . COPD (chronic obstructive pulmonary disease) (HCC)    2L home O2  . Diabetes mellitus without complication (HCC)   . Dyslipidemia   . Hypertension   . Hypocalcemia   . Leukocytosis   . Macrocytic anemia   . NSTEMI (non-ST elevated myocardial infarction) (HCC) 10/15/2016  . Renal disorder    ESRD - MWF HD  . Thrombocytopenia (HCC)     Past Surgical History: History reviewed. No pertinent surgical history.   Family History  Problem Relation Age of Onset  . Diabetes Father    Social History:  reports that he has quit smoking. He has a 5.00 pack-year smoking history. He has never used smokeless tobacco. He reports that he does not drink alcohol or use  drugs.  Allergies: No Known Allergies  Home medications: Prior to Admission medications   Medication Sig Start Date End Date Taking? Authorizing Provider  acetaminophen (ACETAMINOPHEN 8 HOUR) 650 MG CR tablet Take 1,300 mg by mouth every 8 (eight) hours as needed for pain.   Yes Historical Provider, MD  albuterol (PROVENTIL HFA;VENTOLIN HFA) 108 (90 Base) MCG/ACT inhaler Inhale 1 puff into the lungs every 6 (six) hours. 02/06/10  Yes Historical Provider, MD  amLODipine (NORVASC) 10 MG tablet Take 1 tablet by mouth daily. 05/22/10  Yes Historical Provider, MD  CALCIUM CARBONATE PO Take 650 mg by mouth daily.   Yes Historical Provider, MD  ELIQUIS 2.5 MG TABS tablet Take 2 tablets by mouth 2 (two) times daily.  01/11/17  Yes Historical Provider, MD  Folic Acid-Vit B6-Vit B12 (FOLBEE) 2.5-25-1 MG TABS tablet Take 1 tablet by mouth daily.   Yes Historical Provider, MD  insulin aspart (NOVOLOG) 100 UNIT/ML injection Inject 4 Units into the skin 3 (three) times daily before meals.   Yes Historical Provider, MD  Nutritional Supplements (NEPRO PO) Take 1 tablet by mouth daily.   Yes Historical Provider, MD  pantoprazole (PROTONIX) 40 MG tablet Take 40 mg by mouth 2 (two) times daily.   Yes Historical Provider, MD  sevelamer carbonate (RENVELA) 800 MG tablet Take 800 mg by mouth 3 (three) times daily with meals.   Yes Historical Provider, MD  aspirin 81 MG chewable tablet Chew 1 tablet by mouth daily. 10/31/16   Historical Provider, MD  Inpatient medications: . atorvastatin  20 mg Oral q1800  . budesonide (PULMICORT) nebulizer solution  0.25 mg Nebulization BID  . Folic Acid-Vit B6-Vit B12  1 tablet Oral Daily  . hydrocortisone sodium succinate  50 mg Intravenous Q6H  . ipratropium-albuterol  3 mL Nebulization Q6H  . pantoprazole (PROTONIX) IV  40 mg Intravenous Q24H  . piperacillin-tazobactam (ZOSYN)  IV  3.375 g Intravenous Q12H  . sevelamer carbonate  800 mg Oral TID WC  . sodium chloride flush   3 mL Intravenous Q12H   . amiodarone 30 mg/hr (01/13/17 1406)  . dextrose 50 mL/hr at 01/13/17 1300  . heparin 1,100 Units/hr (01/13/17 1608)  . norepinephrine 40 mcg/min (01/13/17 1609)  . phenylephrine (NEO-SYNEPHRINE) Adult infusion 200 mcg/min (01/13/17 1609)  . vasopressin (PITRESSIN) infusion - *FOR SHOCK* Stopped (01/13/17 1230)    Review of Systems See HPI  Physical Exam:  Blood pressure (!) 93/37, pulse (!) 101, temperature 99.9 F (37.7 C), temperature source Oral, resp. rate 18, height 5\' 9"  (1.753 m), weight 72.8 kg (160 lb 9.6 oz), SpO2 100 %.  Gen: Acutely ill appearing AAM Lines/tubes: Central line L IJ, Aline, BIPAP Neck: no JVD, no bruits or LAN Chest: Anterorly coarse BS Heart: Irreg S1S23 No S3  Heart sounds obscured by resp noise/BIPAP Abdomen: soft, nor focally tender Ext: Trace edema LE's Neuro: Awake, alert, but saying "I need to go home" Dialysis Access: left upper arm AVF   Recent Labs Lab February 02, 2017 1144 01/13/17 0641  NA 133* 135  K 3.4* 3.6  CL 95* 97*  CO2 28 25  GLUCOSE 171* 75  BUN 24* 34*  CREATININE 3.51* 4.41*  CALCIUM 9.0 8.8*    Recent Labs Lab 2017-02-02 1144  AST 20  ALT 15*  ALKPHOS 99  BILITOT 1.1  PROT 7.4  ALBUMIN 3.2*    Recent Labs Lab 2017-02-02 1144  LIPASE 15    Recent Labs Lab 01/13/17 0848  AMMONIA 36*     Recent Labs Lab February 02, 2017 1144 01/13/17 0053  WBC 12.0* 6.2  NEUTROABS  --  5.6  HGB 9.1* 9.4*  HCT 28.7* 30.7*  MCV 104.4* 104.1*  PLT 163 154  ) Recent Labs Lab 2017/02/02 1442 02-02-17 1919 01/13/17 0053 01/13/17 0641 01/13/17 1200  TROPONINI 0.05* 0.06* 0.06* 0.38* 1.34*    Recent Labs Lab 01/13/17 0748 01/13/17 0801 01/13/17 0852 01/13/17 0950 01/13/17 1129  GLUCAP 66 56* 83 91 81    Xrays/Other Studies: Dg Chest 2 View  Result Date: 2017/02/02 CLINICAL DATA:  Chest pain.  Dialysis patient EXAM: CHEST  2 VIEW COMPARISON:  None. FINDINGS: Mild cardiac enlargement.  Negative  for edema. Small right pleural effusion. Right lower lobe airspace disease could represent atelectasis or infiltrate. Mild left lower lobe airspace disease and small left effusion. Apical pleural scarring bilaterally.  Left subclavian stent. Dilated colon compatible with adynamic ileus. Multiple displaced right rib fractures, possibly acute. IMPRESSION: Cardiac enlargement with mild vascular congestion Small bilateral effusions and bibasilar atelectasis/infiltrate. Findings suggest fluid overload. Colonic ileus Multiple displaced right rib fractures, possibly acute Electronically Signed   By: Marlan Palau M.D.   On: 02/02/17 12:34   Dg Chest Port 1 View  Result Date: 01/13/2017 CLINICAL DATA:  Central line care encounter EXAM: PORTABLE CHEST 1 VIEW COMPARISON:  Portable exam 1306 hours compared to 0723 hours FINDINGS: New LEFT jugular line with tip projecting over SVC. LEFT axillary stent again identified. Enlargement of cardiac silhouette with pulmonary vascular congestion. Atherosclerotic calcification  aorta. RIGHT basilar atelectasis and small RIGHT pleural effusion. Remaining lungs clear. No pneumothorax. Multiple old RIGHT rib fractures. IMPRESSION: No pneumothorax following LEFT jugular line placement. Aortic atherosclerosis. Persistent mild RIGHT basilar atelectasis and small pleural effusion. Electronically Signed   By: Ulyses Southward M.D.   On: 01/13/2017 13:18   Dg Chest Port 1 View  Result Date: 01/13/2017 CLINICAL DATA:  Shortness of breath tonight. Decreased oxygen saturation. EXAM: PORTABLE CHEST 1 VIEW COMPARISON:  Radiographs 12/25/2016 FINDINGS: Progressive cardiomegaly. Atherosclerosis of the thoracic aorta. Increasing interstitial opacities suspicious for pulmonary edema. Increased bilateral pleural effusions. Worsening bibasilar opacities are likely atelectasis. No evidence of pneumothorax. Multiple right rib fractures again seen. IMPRESSION: Progressive cardiomegaly with increasing  interstitial opacities suspicious for pulmonary edema. Increasing bilateral pleural effusions. Overall findings suggest fluid overload/CHF. Electronically Signed   By: Rubye Oaks M.D.   On: 01/13/2017 01:19   Dg Chest Port 1v Same Day  Result Date: 01/13/2017 CLINICAL DATA:  Shortness of breath. Hypertension. COPD. Congestive heart failure. EXAM: PORTABLE CHEST 1 VIEW COMPARISON:  Earlier same day FINDINGS: Similar appearance. Cardiomegaly. Aortic atherosclerosis. Interstitial and early alveolar pulmonary edema. Small pleural effusions/pleural scarring right more than left. Right-sided rib fractures as seen previously, age indeterminate. IMPRESSION: No change. Cardiomegaly. Interstitial and early alveolar edema. Effusion/pleural scarring right more than left. Rib fractures on the right, age indeterminate. Electronically Signed   By: Paulina Fusi M.D.   On: 01/13/2017 07:40   Dialysis prescription:  MWF [facility] [place].  3.5 hours.  450/A2.0 2K 2.5 Ca UF profile none Access AVF L upper arm 15 ga needles Venofer 100 QTMT (to stop 3/30) Mircera 200 Q2Weeks ? Last dosed 01/01/17  Assessment/Recommendations  1. Acute on chronic resp failure - BIPAP, nebs, steroids, bronchodilators 2. AF with RVR - Cards evaluating. Amio/heparin 3. Shock - septic vs cardiogenic. Currently 2 pressors. ATB's (V/Z). Cultures pending.  4. ESRD - MWF HD. Too unstable for HD at this time. Obligatory fluid intake with drips/pressors/etc right now close to 300-350 cc/hour and will require CRRT to keep up with these requirements. Will need to ask CCM for line placement. 5. Anemia - Mircera as outpt. Hb 7.9 on 01/04/17 at outpt HD unit. Transfuse for <7.   Camille Bal,  MD [facility] [phone] pager 01/13/2017, 4:51 PM

## 2017-01-13 NOTE — Procedures (Signed)
Central Venous Catheter Insertion Procedure Note Louis Sutton [id number] 1932/08/12  Procedure: Insertion of Central Venous Catheter Indications: Assessment of intravascular volume, Drug and/or fluid administration and Frequent blood sampling  Procedure Details Consent: Risks of procedure as well as the alternatives and risks of each were explained to the (patient/caregiver).  Consent for procedure obtained. Time Out: Verified patient identification, verified procedure, site/side was marked, verified correct patient position, special equipment/implants available, medications/allergies/relevent history reviewed, required imaging and test results available.  Performed  Maximum sterile technique was used including antiseptics, cap, gloves, gown, hand hygiene, mask and sheet. Skin prep: Chlorhexidine; local anesthetic administered A antimicrobial bonded/coated triple lumen catheter was placed in the left internal jugular vein using the Seldinger technique. Ultrasound guidance used.Yes.   Catheter placed to 20 cm. Blood aspirated via all 3 ports and then flushed x 3. Line sutured x 2 and dressing applied.  Evaluation Blood flow good Complications: No apparent complications Patient did tolerate procedure well. Chest X-ray ordered to verify placement.  CXR: pending.  Brett Canales Minor ACNP Adolph Pollack PCCM Pager [phone] till 3 pm If no answer page [phone] 01/13/2017, 12:36 PM

## 2017-01-13 NOTE — Progress Notes (Signed)
OT Cancellation Note  Patient Details Name: Louis Sutton MRN: [id number] DOB: May 29, 1932   Cancelled Treatment:    Reason Eval/Treat Not Completed: Medical issues which prohibited therapy (pt on bipap and transferring to ICU). Will follow up for OT eval as time allows and pt is medically appropriate.  Gaye Alken M.S., OTR/L Pager: [phone]  01/13/2017, 9:15 AM

## 2017-01-13 NOTE — Progress Notes (Signed)
Pharmacy Antibiotic Consult Note  Louis Sutton is a 83 y.o. male admitted on Jan 25, 2017 with CP, now w/ concern for PNA.  Pharmacy has been consulted for vancomycin and cefepime dosing.  Plan: Vancomycin 1500mg  IV x1 followed by 750mg  IV with each HD.  Goal pre-HD vanc level 15-25. Cefepime 2g IV x1 then 1g IV Q24H.  Height: 5\' 9"  (175.3 cm) Weight: 160 lb 9.6 oz (72.8 kg) IBW/kg (Calculated) : 70.7  Temp (24hrs), Avg:99.7 F (37.6 C), Min:97.5 F (36.4 C), Max:102.5 F (39.2 C)   Recent Labs Lab 25-Jan-2017 1144 01/13/17 0053 01/13/17 0641  WBC 12.0* 6.2  --   CREATININE 3.51*  --  4.41*    Estimated Creatinine Clearance: 12.9 mL/min (A) (by C-G formula based on SCr of 4.41 mg/dL (H)).    No Known Allergies   Thank you for allowing pharmacy to be a part of this patient's care.  Colleen Can 01/13/2017 7:49 AM

## 2017-01-13 NOTE — Procedures (Signed)
Arterial Catheter Insertion Procedure Note Noland Lanigan [id number] 1932-01-01  Procedure: Insertion of Arterial Catheter  Indications: Blood pressure monitoring and Frequent blood sampling  Procedure Details Consent: Unable to obtain consent because of emergent medical necessity. Time Out: Verified patient identification, verified procedure, site/side was marked, verified correct patient position, special equipment/implants available, medications/allergies/relevent history reviewed, required imaging and test results available.  Performed  Maximum sterile technique was used including antiseptics, cap, gloves, gown, hand hygiene, mask and sheet. Skin prep: Chlorhexidine; local anesthetic administered 20 gauge catheter was inserted into right radial artery using the Seldinger technique.  Evaluation Blood flow good; BP tracing good. Complications: No apparent complications.   Koren Bound 01/13/2017

## 2017-01-13 NOTE — Progress Notes (Signed)
Called by RN, patient profoundly hypotensive on high dose pressors.  Mental status is poor and severely hypotensive.  Patient is on high dose pressors.  On exam, he is protecting his airway but WOB necessitated BiPAP.  Placed a-line in right radial and confirmed severe hypotension.  Will place TLC and f/u CXR.  Change BiPAP to PRN.  Will monitor in ICU.  Contacted family with no response.  Need to address code status.  The patient is critically ill with multiple organ systems failure and requires high complexity decision making for assessment and support, frequent evaluation and titration of therapies, application of advanced monitoring technologies and extensive interpretation of multiple databases.   Critical Care Time devoted to patient care services described in this note is  45  Minutes. This time reflects time of care of this signee Dr Koren Bound. This critical care time does not reflect procedure time, or teaching time or supervisory time of PA/NP/Med student/Med Resident etc but could involve care discussion time.  Alyson Reedy, M.D. [facility] Pulmonary/Critical Care Medicine. Pager: [phone]. After hours pager: [phone].

## 2017-01-13 NOTE — Assessment & Plan Note (Signed)
Seen admit cxr 12/29/2016. Age unknown. No old films  Plan Await details on CT chest - CTA

## 2017-01-14 ENCOUNTER — Inpatient Hospital Stay (HOSPITAL_COMMUNITY): Payer: Medicare Other

## 2017-01-14 DIAGNOSIS — I255 Ischemic cardiomyopathy: Secondary | ICD-10-CM

## 2017-01-14 DIAGNOSIS — I5032 Chronic diastolic (congestive) heart failure: Secondary | ICD-10-CM

## 2017-01-14 LAB — APTT
APTT: 193 s — AB (ref 24–36)
aPTT: 136 seconds — ABNORMAL HIGH (ref 24–36)

## 2017-01-14 LAB — IRON AND TIBC
Iron: 105 ug/dL (ref 45–182)
SATURATION RATIOS: 82 % — AB (ref 17.9–39.5)
TIBC: 127 ug/dL — ABNORMAL LOW (ref 250–450)
UIBC: 22 ug/dL

## 2017-01-14 LAB — GLUCOSE, CAPILLARY
GLUCOSE-CAPILLARY: 161 mg/dL — AB (ref 65–99)
GLUCOSE-CAPILLARY: 133 mg/dL — AB (ref 65–99)
GLUCOSE-CAPILLARY: 134 mg/dL — AB (ref 65–99)
GLUCOSE-CAPILLARY: 145 mg/dL — AB (ref 65–99)
Glucose-Capillary: 158 mg/dL — ABNORMAL HIGH (ref 65–99)
Glucose-Capillary: 163 mg/dL — ABNORMAL HIGH (ref 65–99)
Glucose-Capillary: 138 mg/dL — ABNORMAL HIGH (ref 65–99)

## 2017-01-14 LAB — ECHOCARDIOGRAM COMPLETE
Height: 69 in
WEIGHTICAEL: 2786.61 [oz_av]

## 2017-01-14 LAB — RENAL FUNCTION PANEL
ALBUMIN: 2.5 g/dL — AB (ref 3.5–5.0)
ALBUMIN: 2.5 g/dL — AB (ref 3.5–5.0)
ANION GAP: 19 — AB (ref 5–15)
ANION GAP: 21 — AB (ref 5–15)
BUN: 36 mg/dL — AB (ref 6–20)
BUN: 28 mg/dL — AB (ref 6–20)
CALCIUM: 8.1 mg/dL — AB (ref 8.9–10.3)
CO2: 15 mmol/L — ABNORMAL LOW (ref 22–32)
CO2: 13 mmol/L — AB (ref 22–32)
Calcium: 8.1 mg/dL — ABNORMAL LOW (ref 8.9–10.3)
Chloride: 96 mmol/L — ABNORMAL LOW (ref 101–111)
Chloride: 98 mmol/L — ABNORMAL LOW (ref 101–111)
Creatinine, Ser: 4.04 mg/dL — ABNORMAL HIGH (ref 0.61–1.24)
Creatinine, Ser: 3.01 mg/dL — ABNORMAL HIGH (ref 0.61–1.24)
GFR calc Af Amer: 15 mL/min — ABNORMAL LOW (ref >60)
GFR calc Af Amer: 21 mL/min — ABNORMAL LOW (ref >60)
GFR calc non Af Amer: 18 mL/min — ABNORMAL LOW (ref >60)
GFR, EST NON AFRICAN AMERICAN: 13 mL/min — AB (ref >60)
GLUCOSE: 162 mg/dL — AB (ref 65–99)
Glucose, Bld: 142 mg/dL — ABNORMAL HIGH (ref 65–99)
PHOSPHORUS: 4 mg/dL (ref 2.5–4.6)
PHOSPHORUS: 3.7 mg/dL (ref 2.5–4.6)
POTASSIUM: 4.2 mmol/L (ref 3.5–5.1)
POTASSIUM: 4.3 mmol/L (ref 3.5–5.1)
SODIUM: 130 mmol/L — AB (ref 135–145)
Sodium: 132 mmol/L — ABNORMAL LOW (ref 135–145)

## 2017-01-14 LAB — MAGNESIUM: MAGNESIUM: 1.8 mg/dL (ref 1.7–2.4)

## 2017-01-14 LAB — CBC
HCT: 26.4 % — ABNORMAL LOW (ref 39.0–52.0)
HEMOGLOBIN: 8.2 g/dL — AB (ref 13.0–17.0)
MCH: 31.2 pg (ref 26.0–34.0)
MCHC: 31.1 g/dL (ref 30.0–36.0)
MCV: 100.4 fL — ABNORMAL HIGH (ref 78.0–100.0)
PLATELETS: 110 10*3/uL — AB (ref 150–400)
RBC: 2.63 MIL/uL — ABNORMAL LOW (ref 4.22–5.81)
RDW: 18.3 % — AB (ref 11.5–15.5)
WBC: 30.4 10*3/uL — ABNORMAL HIGH (ref 4.0–10.5)

## 2017-01-14 LAB — PROCALCITONIN: Procalcitonin: 24.92 ng/mL

## 2017-01-14 LAB — TROPONIN I: TROPONIN I: 1.04 ng/mL — AB (ref <0.03)

## 2017-01-14 LAB — MRSA PCR SCREENING: MRSA BY PCR: NEGATIVE

## 2017-01-14 LAB — HEPARIN LEVEL (UNFRACTIONATED): HEPARIN UNFRACTIONATED: 1.12 [IU]/mL — AB (ref 0.30–0.70)

## 2017-01-14 MED ORDER — SODIUM CHLORIDE 0.9% FLUSH
10.0000 mL | Freq: Two times a day (BID) | INTRAVENOUS | Status: DC
Start: 2017-01-14 — End: 2017-01-16
  Administered 2017-01-14 – 2017-01-16 (×5): 10 mL

## 2017-01-14 MED ORDER — SODIUM CHLORIDE 0.9% FLUSH: 10.0000 mL | INTRAVENOUS | Status: DC | PRN

## 2017-01-14 MED ORDER — MAGNESIUM SULFATE 2 GM/50ML IV SOLN
2.0000 g | Freq: Once | INTRAVENOUS | Status: AC
  Administered 2017-01-14: 2 g via INTRAVENOUS
  Filled 2017-01-14: qty 50

## 2017-01-14 MED ORDER — VANCOMYCIN HCL IN DEXTROSE 750-5 MG/150ML-% IV SOLN
750.0000 mg | INTRAVENOUS | Status: DC
  Administered 2017-01-14: 750 mg via INTRAVENOUS
  Filled 2017-01-14: qty 150

## 2017-01-14 MED ORDER — HEPARIN (PORCINE) IN NACL 100-0.45 UNIT/ML-% IJ SOLN: 700.0000 [IU]/h | INTRAMUSCULAR | Status: DC

## 2017-01-14 MED ORDER — PIPERACILLIN-TAZOBACTAM 3.375 G IVPB 30 MIN
3.3750 g | Freq: Four times a day (QID) | INTRAVENOUS | Status: DC
  Administered 2017-01-14 – 2017-01-15 (×5): 3.375 g via INTRAVENOUS
  Filled 2017-01-14 (×7): qty 50

## 2017-01-14 MED ORDER — ALPRAZOLAM 0.25 MG PO TABS
0.2500 mg | ORAL_TABLET | Freq: Once | ORAL | Status: AC
  Administered 2017-01-14: 0.25 mg via ORAL
  Filled 2017-01-14: qty 1

## 2017-01-14 MED ORDER — HEPARIN (PORCINE) IN NACL 100-0.45 UNIT/ML-% IJ SOLN: 500.0000 [IU]/h | INTRAMUSCULAR | Status: DC

## 2017-01-14 MED ORDER — CHLORHEXIDINE GLUCONATE CLOTH 2 % EX PADS
6.0000 | MEDICATED_PAD | Freq: Every day | CUTANEOUS | Status: DC
  Administered 2017-01-15 – 2017-01-16 (×2): 6 via TOPICAL

## 2017-01-14 MED ORDER — DARBEPOETIN ALFA 150 MCG/0.3ML IJ SOSY
150.0000 ug | PREFILLED_SYRINGE | INTRAMUSCULAR | Status: DC
  Administered 2017-01-14: 150 ug via SUBCUTANEOUS
  Filled 2017-01-14 (×2): qty 0.3

## 2017-01-14 NOTE — Progress Notes (Signed)
Infectious disease, MD called and notified of positive blood cultures. Patient is not to be put on isolation precautions at this time per Infectious Disease.

## 2017-01-14 NOTE — Progress Notes (Signed)
ANTICOAGULATION CONSULT NOTE - follow up Consult  Pharmacy Consult for heparin Indication: atrial fibrillation  No Known Allergies  Patient Measurements: Height: 5\' 9"  (175.3 cm) Weight: 174 lb 2.6 oz (79 kg) IBW/kg (Calculated) : 70.7  Vital Signs: Temp: 97.5 F (36.4 C) (03/29 2000) Temp Source: Axillary (03/29 2000) Pulse Rate: 93 (03/29 2112)  Labs:  Recent Labs  12/29/2016 1144  01/13/17 0053 01/13/17 0641 01/13/17 1200 01/13/17 1735 01/13/17 2123 01/14/17 0000 01/14/17 0320 01/14/17 0900 01/14/17 1600 01/14/17 2015  HGB 9.1*  --  9.4*  --   --   --   --   --  8.2*  --   --   --   HCT 28.7*  --  30.7*  --   --   --   --   --  26.4*  --   --   --   PLT 163  --  154  --   --   --   --   --  110*  --   --   --   APTT  --   --   --   --   --  128*  --   --   --  193*  --  136*  LABPROT 19.6*  --   --   --   --   --   --   --   --   --   --   --   INR 1.63  --   --   --   --   --   --   --   --   --   --   --   HEPARINUNFRC  --   --   --   --   --   --   --   --   --  1.12*  --   --   CREATININE 3.51*  --   --  4.41*  --   --   --   --  4.04*  --  3.01*  --   TROPONINI 0.03*  < > 0.06* 0.38* 1.34*  --  1.47* 1.04*  --   --   --   --   < > = values in this interval not displayed.  Estimated Creatinine Clearance: 18.9 mL/min (A) (by C-G formula based on SCr of 3.01 mg/dL (H)).   Medications (infusions):  . amiodarone 30 mg/hr (01/14/17 1900)  . dextrose 10 mL/hr at 01/14/17 1900  . heparin 700 Units/hr (01/14/17 2000)  . norepinephrine 40 mcg/min (01/14/17 2112)  . phenylephrine (NEO-SYNEPHRINE) Adult infusion 110 mcg/min (01/14/17 1911)  . dialysis replacement fluid (prismasate) 300 mL/hr at 01/14/17 1704  . dialysis replacement fluid (prismasate) 300 mL/hr at 01/14/17 0015  . dialysate (PRISMASATE) 1,000 mL/hr at 01/14/17 1958  . vasopressin (PITRESSIN) infusion - *FOR SHOCK* 0.03 Units/min (01/14/17 1900)    Assessment: 83yo male with ESRD who has been on  Eliquis for Afib now to transition to heparin; last dose of Eliquis 2100 3/27; Eliquis will affect anti-Xa levels so will manage w/ aPTT until Eliquis cleared. Plan to consider switch to warfarin once able to take PO's and no procedures planned.  PTT remains elevated at 136 on 700 units/hr. No bleeding noted.   Goal of Therapy:  Heparin level 0.3-0.7 units/ml aPTT 66-102 seconds Monitor platelets by anticoagulation protocol: Yes   Plan:  Hold heparin drip for 30 min Restart heparin drip at 500 units/hr ~22:05 tonight - plan communicated with  RN 8 hour aPTT Monitor daily aPTT/heparin level Watch for s/sx of bleeding   Loura Back, PharmD, BCPS Clinical Pharmacist Phone for today [phone] Main pharmacy - [phone] 01/14/2017 9:26 PM

## 2017-01-14 NOTE — Progress Notes (Signed)
Patient requested to be placed on bipap.  Currently tolerating well.  Will continue to monitor.

## 2017-01-14 NOTE — Progress Notes (Addendum)
Pharmacy Antibiotic Consult Note  Louis Sutton is a 83 y.o. male admitted on 01/15/2017 with sepsis.  Pharmacy has been consulted for Vancocin and Zosyn dosing.  Pt has begun CRRT, must adjust ABX.  Plan: Vancomycin 750mg  IV every 24 hours.  Goal trough 15-20 mcg/mL. Zosyn 3.375g IV every 6 hours (30-min infusion).  Height: 5\' 9"  (175.3 cm) Weight: 160 lb 9.6 oz (72.8 kg) IBW/kg (Calculated) : 70.7  Temp (24hrs), Avg:99.5 F (37.5 C), Min:97.5 F (36.4 C), Max:102.5 F (39.2 C)   Recent Labs Lab 12/21/2016 1144 01/13/17 0053 01/13/17 0641 01/13/17 0848 01/13/17 1320  WBC 12.0* 6.2  --   --   --   CREATININE 3.51*  --  4.41*  --   --   LATICACIDVEN  --   --   --  6.3* 4.0*    Estimated Creatinine Clearance: 12.9 mL/min (A) (by C-G formula based on SCr of 4.41 mg/dL (H)).    No Known Allergies  Antimicrobials this admission: Vanc 3/28 >>  Zosyn 3/28 >>   Microbiology results: 3/28 bld x2>> GNR 3/28 BCID - kleb   Thank you for allowing pharmacy to be a part of this patient's care.  Colleen Can 01/14/2017 1:08 AM

## 2017-01-14 NOTE — Progress Notes (Signed)
  Echocardiogram 2D Echocardiogram has been performed.  Leta Jungling M 01/14/2017, 2:45 PM

## 2017-01-14 NOTE — Progress Notes (Signed)
[facility] Rounding Note  Subjective/Interval History:  Off BIPAP, on nasal cannula Denies pain or SOB Issues with BP measurement - a-line was malfunctioning, re-dressed - now reading 80-100 points higher than lower leg BP  Blood cultures + Klebsiella (note had EColi bacteremia in December) Started CRRT last night d/t large obligatory fluid intake/shock  Objective Vital signs in last 24 hours: Vitals:   01/14/17 0600 01/14/17 0700 01/14/17 0859 01/14/17 0912  BP:      Pulse: 91 95    Resp: 15 (!) 23    Temp:  (!) 96.2 F (35.7 C)    TempSrc:  Oral    SpO2: 99% 100% 96% 96%  Weight:      Height:       Weight change: 1.435 kg (3 lb 2.6 oz)  Intake/Output Summary (Last 24 hours) at 01/14/17 0929 Last data filed at 01/14/17 0900  Gross per 24 hour  Intake          5847.14 ml  Output             1524 ml  Net          4323.14 ml   Physical Exam:  Blood pressure (!) 87/19, pulse 95, temperature (!) 96.2 F (35.7 C), temperature source Oral, resp. rate (!) 23, height 5\' 9"  (1.753 m), weight 79 kg (174 lb 2.6 oz), SpO2 96 %.  Gen: Off BIPAP and looks comfortable Lines/tubes: Central line L IJ, Aline, BIPAP, R fem trialysis catheter + JVD to mandible, periorbital edema Crackles laterally chest Irreg S1S23 No S3   Abdomen: soft, not focally tender 1+ edema LE's Awake, alert, oriented, able to give history Dialysis Access:   left upper arm AVF + bruit but faint  R femoral trialysis catheter  Labs: Basic Metabolic Panel:  Recent Labs Lab 02/10/17 1144 01/13/17 0641 01/14/17 0320  NA 133* 135 130*  K 3.4* 3.6 4.2  CL 95* 97* 96*  CO2 28 25 15*  GLUCOSE 171* 75 162*  BUN 24* 34* 36*  CREATININE 3.51* 4.41* 4.04*  CALCIUM 9.0 8.8* 8.1*  PHOS  --   --  4.0     Recent Labs Lab 02-10-17 1144 01/14/17 0320  AST 20  --   ALT 15*  --   ALKPHOS 99  --   BILITOT 1.1  --   PROT 7.4  --   ALBUMIN 3.2* 2.5*    Recent Labs Lab Feb 10, 2017 1144  LIPASE 15    Recent  Labs Lab 01/13/17 0848  AMMONIA 36*     Recent Labs Lab 2017-02-10 1144 01/13/17 0053 01/14/17 0320  WBC 12.0* 6.2 30.4*  NEUTROABS  --  5.6  --   HGB 9.1* 9.4* 8.2*  HCT 28.7* 30.7* 26.4*  MCV 104.4* 104.1* 100.4*  PLT 163 154 110*   Recent Labs Lab 01/13/17 0053 01/13/17 0641 01/13/17 1200 01/13/17 2123 01/14/17 0000  TROPONINI 0.06* 0.38* 1.34* 1.47* 1.04*   CBG:  Recent Labs Lab 01/13/17 1727 01/13/17 2059 01/14/17 0036 01/14/17 0412 01/14/17 0753  GLUCAP 154* 158* 163* 161* 133*    Studies/Results: Dg Chest 2 View  Result Date: 02-10-17 CLINICAL DATA:  Chest pain.  Dialysis patient EXAM: CHEST  2 VIEW COMPARISON:  None. FINDINGS: Mild cardiac enlargement.  Negative for edema. Small right pleural effusion. Right lower lobe airspace disease could represent atelectasis or infiltrate. Mild left lower lobe airspace disease and small left effusion. Apical pleural scarring bilaterally.  Left subclavian stent. Dilated colon compatible with  adynamic ileus. Multiple displaced right rib fractures, possibly acute. IMPRESSION: Cardiac enlargement with mild vascular congestion Small bilateral effusions and bibasilar atelectasis/infiltrate. Findings suggest fluid overload. Colonic ileus Multiple displaced right rib fractures, possibly acute Electronically Signed   By: Marlan Palau M.D.   On: 24-Jan-2017 12:34   Ct Angio Chest Pe W Or Wo Contrast  Result Date: 01/13/2017 CLINICAL DATA:  Initial evaluation for acute shortness of breath. EXAM: CT ANGIOGRAPHY CHEST WITH CONTRAST TECHNIQUE: Multidetector CT imaging of the chest was performed using the standard protocol during bolus administration of intravenous contrast. Multiplanar CT image reconstructions and MIPs were obtained to evaluate the vascular anatomy. CONTRAST:  100 cc of Isovue 370. COMPARISON:  Prior radiograph from earlier the same day. FINDINGS: Cardiovascular: Intrathoracic aorta of normal caliber. Extensive plaque  throughout the aorta and visualized great vessels. No aneurysm. Fairly pronounced cardiomegaly with diffuse 3 vessel coronary artery calcifications. Trace pericardial fluid noted. Evaluation pulmonary arterial tree somewhat limited by timing of the contrast bolus. Evaluation for possible small distal emboli limited on this exam. Main pulmonary artery within normal limits for size measuring 3 cm in diameter. No large or central filling defect to suggest acute pulmonary embolism identified. Re-formatted imaging confirms these findings. The Mediastinum/Nodes: Visualized thyroid mildly heterogeneous but grossly unremarkable. Mildly enlarged 15 mm right paratracheal lymph node (series 5, image 40). Additional shotty subcentimeter mediastinal nodes noted. No hilar adenopathy. No appreciable axillary adenopathy. Esophagus within normal limits. Lungs/Pleura: Tracheobronchial tree is patent. Layering bilateral pleural effusions. Associated bibasilar opacities likely atelectasis, although superimposed infiltrate could be considered in the correct clinical setting. Diffuse hazy ground-glass opacity within the lungs most likely reflects edema. Underlying paraseptal emphysema. No pneumothorax. No worrisome pulmonary nodule or mass. Upper Abdomen: Small volume ascites present within the partially visualized upper abdomen. At remainder of the partially visualized upper abdomen otherwise unremarkable. Musculoskeletal: Extensive venous collaterals present within knee right chest wall. No acute osseous abnormality. No worrisome lytic or blastic osseous lesions. Multiple remotely healed right-sided rib fractures noted. Review of the MIP images confirms the above findings. IMPRESSION: 1. No large or central pulmonary embolism identified. Please note evaluation for possible distal small emboli somewhat limited on this exam due to timing of the contrast bolus and motion artifact. 2. Cardiomegaly with layering bilateral pleural effusions.  Diffuse ground-glass opacity involving the bilateral lungs most likely reflects pulmonary edema. 3. Superimposed bibasilar airspace opacities, likely atelectasis, although superimposed infiltrate could be considered in the correct clinical setting. 4. Underlying emphysema. 5. Ascites within the visualized upper abdomen. 6. Advanced aortic atherosclerosis with diffuse 3 vessel coronary artery calcifications. Electronically Signed   By: Rise Mu M.D.   On: 01/13/2017 17:36   Dg Chest Port 1 View  Result Date: 01/14/2017 CLINICAL DATA:  Shortness of breath, CHF, end-stage renal disease, COPD. EXAM: PORTABLE CHEST 1 VIEW COMPARISON:  Portable chest x-ray dated January 13, 2017 FINDINGS: The lungs are adequately inflated. There is a persistent right pleural effusion laterally and superiorly. There is mild shift of the mediastinum toward the right which is stable. The cardiac silhouette remains enlarged. There is dense calcification in the wall of the thoracic aorta. The pulmonary vascularity is indistinct. There is no pleural effusion. The left internal jugular venous catheter tip projects over the midportion of the SVC. Multiple right posterior rib fractures are again observed. IMPRESSION: Stable appearance of the chest since yesterday's study. CHF with mild interstitial edema. Small right pleural effusion. No pneumothorax. Multiple displaced right posterior rib fractures.  Electronically Signed   By: David  [place] M.D.   On: 01/14/2017 07:59   Dg Chest Port 1 View  Result Date: 01/13/2017 CLINICAL DATA:  Encounter for central line placement. Status post unsuccessful attempt to place a central lawn. EXAM: PORTABLE CHEST 1 VIEW COMPARISON:  Chest CT earlier this day, multiple prior radiographs. FINDINGS: The tip of the left internal jugular central venous catheter is in the region of the mid SVC. No pneumothorax. No additional central line is seen. Cardiomegaly is stable. Small pleural effusions, right  greater than left, unchanged. Stable pulmonary vascular engorgement probable edema. No new focal airspace disease. Vascular stent in the region of the left subclavian. IMPRESSION: Unchanged appearance of the chest. Tip of the left central line in the SVC. No pneumothorax. Electronically Signed   By: Rubye Oaks M.D.   On: 01/13/2017 21:04   Dg Chest Port 1 View  Result Date: 01/13/2017 CLINICAL DATA:  Central line care encounter EXAM: PORTABLE CHEST 1 VIEW COMPARISON:  Portable exam 1306 hours compared to 0723 hours FINDINGS: New LEFT jugular line with tip projecting over SVC. LEFT axillary stent again identified. Enlargement of cardiac silhouette with pulmonary vascular congestion. Atherosclerotic calcification aorta. RIGHT basilar atelectasis and small RIGHT pleural effusion. Remaining lungs clear. No pneumothorax. Multiple old RIGHT rib fractures. IMPRESSION: No pneumothorax following LEFT jugular line placement. Aortic atherosclerosis. Persistent mild RIGHT basilar atelectasis and small pleural effusion. Electronically Signed   By: Ulyses Southward M.D.   On: 01/13/2017 13:18   Dg Chest Port 1 View  Result Date: 01/13/2017 CLINICAL DATA:  Shortness of breath tonight. Decreased oxygen saturation. EXAM: PORTABLE CHEST 1 VIEW COMPARISON:  Radiographs January 30, 2017 FINDINGS: Progressive cardiomegaly. Atherosclerosis of the thoracic aorta. Increasing interstitial opacities suspicious for pulmonary edema. Increased bilateral pleural effusions. Worsening bibasilar opacities are likely atelectasis. No evidence of pneumothorax. Multiple right rib fractures again seen. IMPRESSION: Progressive cardiomegaly with increasing interstitial opacities suspicious for pulmonary edema. Increasing bilateral pleural effusions. Overall findings suggest fluid overload/CHF. Electronically Signed   By: Rubye Oaks M.D.   On: 01/13/2017 01:19   Dg Chest Port 1v Same Day  Result Date: 01/13/2017 CLINICAL DATA:  Shortness of  breath. Hypertension. COPD. Congestive heart failure. EXAM: PORTABLE CHEST 1 VIEW COMPARISON:  Earlier same day FINDINGS: Similar appearance. Cardiomegaly. Aortic atherosclerosis. Interstitial and early alveolar pulmonary edema. Small pleural effusions/pleural scarring right more than left. Right-sided rib fractures as seen previously, age indeterminate. IMPRESSION: No change. Cardiomegaly. Interstitial and early alveolar edema. Effusion/pleural scarring right more than left. Rib fractures on the right, age indeterminate. Electronically Signed   By: Paulina Fusi M.D.   On: 01/13/2017 07:40   Medications: . amiodarone 30 mg/hr (01/13/17 2301)  . dextrose 50 mL/hr at 01/13/17 2000  . heparin 1,100 Units/hr (01/14/17 0610)  . norepinephrine 38 mcg/min (01/14/17 0537)  . phenylephrine (NEO-SYNEPHRINE) Adult infusion 150 mcg/min (01/14/17 0534)  . dialysis replacement fluid (prismasate) 300 mL/hr at 01/14/17 0015  . dialysis replacement fluid (prismasate) 300 mL/hr at 01/14/17 0015  . dialysate (PRISMASATE) 1,000 mL/hr at 01/14/17 0504  . vasopressin (PITRESSIN) infusion - *FOR SHOCK* 0.03 Units/min (01/14/17 0051)   . atorvastatin  20 mg Oral q1800  . budesonide (PULMICORT) nebulizer solution  0.25 mg Nebulization BID  . chlorhexidine  15 mL Mouth Rinse BID  . Chlorhexidine Gluconate Cloth  6 each Topical Q0600  . Folic Acid-Vit B6-Vit B12  1 tablet Oral Daily  . hydrocortisone sodium succinate  50 mg Intravenous Q6H  .  ipratropium-albuterol  3 mL Nebulization Q6H  . mouth rinse  15 mL Mouth Rinse q12n4p  . pantoprazole (PROTONIX) IV  40 mg Intravenous Q24H  . piperacillin-tazobactam  3.375 g Intravenous Q6H  . sevelamer carbonate  800 mg Oral TID WC  . sodium chloride flush  10-40 mL Intracatheter Q12H  . sodium chloride flush  3 mL Intravenous Q12H  . vancomycin  750 mg Intravenous Q24H   Dialysis prescription:  MWF [facility] [place].  EDW 71 kg  3.5 hours.  450/A2.0 2K 2.5 Ca UF profile  none Access AVF L upper arm 15 ga needles Venofer 100 QTMT (to stop 3/30) Mircera 200 Q2Weeks ? Last dosed 01/01/17   Assessment/Recommendations  1. Acute on chronic resp failure - BIPAP, nebs, steroids, bronchodilators->off BIPAP today, CXR with edema (weight up 8 kg over EDW). Start pulling fluid gently w/CRRT 2. AF with RVR - Cards evaluating. Amio/heparin at present 3. Klebsiella bacteremia - on zosyn (and vanco). ? Source (had EColi bacteremia in 09/2016) 4. Shock - septic vs cardiogenic. Currently 3 pressors. ATB's (V/Z). Cultures + Klebs. WBC 30K 5. ESRD - MWF HD. Too unstable for HD. CRRT started 3/28 through R fem trialysis catheter. About 7 kg over EDW now. Start pulling fluid with CRRT neg 50/hour. Back to IHD once off pressors. 6. Anemia - Mircera as outpt. Hb 7.9 on 01/04/17 at outpt HD unit. Transfuse for <7. Start Aranesp 150/week. No IV Fe with bacteremia 7. h/o RUE DVT (dx Dec 2017) R arm looks more edematous to me today than yesterday. On heparin already. 8. LUE AVF - bruit less prominent. Hope has not partially occluded AVF in setting of yesterday's profound hypotension.   Camille Bal, MD [facility] [phone] pager 01/14/2017, 9:29 AM

## 2017-01-14 NOTE — Progress Notes (Signed)
ANTICOAGULATION CONSULT NOTE - follow up Consult  Pharmacy Consult for heparin Indication: atrial fibrillation  No Known Allergies  Patient Measurements: Height: 5\' 9"  (175.3 cm) Weight: 174 lb 2.6 oz (79 kg) IBW/kg (Calculated) : 70.7  Vital Signs: Temp: 96.2 F (35.7 C) (03/29 0700) Temp Source: Oral (03/29 0700) Pulse Rate: 95 (03/29 0700)  Labs:  Recent Labs  12/27/2016 1144  01/13/17 0053 01/13/17 0641 01/13/17 1200 01/13/17 1735 01/13/17 2123 01/14/17 0000 01/14/17 0320 01/14/17 0900  HGB 9.1*  --  9.4*  --   --   --   --   --  8.2*  --   HCT 28.7*  --  30.7*  --   --   --   --   --  26.4*  --   PLT 163  --  154  --   --   --   --   --  110*  --   APTT  --   --   --   --   --  128*  --   --   --  193*  LABPROT 19.6*  --   --   --   --   --   --   --   --   --   INR 1.63  --   --   --   --   --   --   --   --   --   HEPARINUNFRC  --   --   --   --   --   --   --   --   --  1.12*  CREATININE 3.51*  --   --  4.41*  --   --   --   --  4.04*  --   TROPONINI 0.03*  < > 0.06* 0.38* 1.34*  --  1.47* 1.04*  --   --   < > = values in this interval not displayed.  Estimated Creatinine Clearance: 14.1 mL/min (A) (by C-G formula based on SCr of 4.04 mg/dL (H)).   Medical History: Past Medical History:  Diagnosis Date  . Asthma   . Atrial fibrillation (HCC)   . CHF (congestive heart failure) (HCC)   . COPD (chronic obstructive pulmonary disease) (HCC)    2L home O2  . Diabetes mellitus without complication (HCC)   . Dyslipidemia   . Hypertension   . Hypocalcemia   . Leukocytosis   . Macrocytic anemia   . NSTEMI (non-ST elevated myocardial infarction) (HCC) 10/15/2016  . Renal disorder    ESRD - MWF HD  . Thrombocytopenia (HCC)     Medications:  Prescriptions Prior to Admission  Medication Sig Dispense Refill Last Dose  . acetaminophen (ACETAMINOPHEN 8 HOUR) 650 MG CR tablet Take 1,300 mg by mouth every 8 (eight) hours as needed for pain.     Marland Kitchen albuterol  (PROVENTIL HFA;VENTOLIN HFA) 108 (90 Base) MCG/ACT inhaler Inhale 1 puff into the lungs every 6 (six) hours.     Marland Kitchen amLODipine (NORVASC) 10 MG tablet Take 1 tablet by mouth daily.   01/02/2017 at Unknown time  . CALCIUM CARBONATE PO Take 650 mg by mouth daily.     Marland Kitchen ELIQUIS 2.5 MG TABS tablet Take 2 tablets by mouth 2 (two) times daily.   0 12/27/2016 at 800  . Folic Acid-Vit B6-Vit B12 (FOLBEE) 2.5-25-1 MG TABS tablet Take 1 tablet by mouth daily.     . insulin aspart (NOVOLOG) 100 UNIT/ML injection Inject 4  Units into the skin 3 (three) times daily before meals.     . Nutritional Supplements (NEPRO PO) Take 1 tablet by mouth daily.   01/28/2017 at Unknown time  . pantoprazole (PROTONIX) 40 MG tablet Take 40 mg by mouth 2 (two) times daily.   01-28-2017 at Unknown time  . sevelamer carbonate (RENVELA) 800 MG tablet Take 800 mg by mouth 3 (three) times daily with meals.   01/11/2017 at Unknown time  . aspirin 81 MG chewable tablet Chew 1 tablet by mouth daily.   Not Taking at Unknown time   Scheduled:  . atorvastatin  20 mg Oral q1800  . budesonide (PULMICORT) nebulizer solution  0.25 mg Nebulization BID  . chlorhexidine  15 mL Mouth Rinse BID  . Chlorhexidine Gluconate Cloth  6 each Topical Q0600  . darbepoetin (ARANESP) injection - NON-DIALYSIS  150 mcg Subcutaneous Q Thu-1800  . Folic Acid-Vit B6-Vit B12  1 tablet Oral Daily  . hydrocortisone sodium succinate  50 mg Intravenous Q6H  . ipratropium-albuterol  3 mL Nebulization Q6H  . magnesium sulfate 1 - 4 g bolus IVPB  2 g Intravenous Once  . mouth rinse  15 mL Mouth Rinse q12n4p  . pantoprazole (PROTONIX) IV  40 mg Intravenous Q24H  . piperacillin-tazobactam  3.375 g Intravenous Q6H  . sevelamer carbonate  800 mg Oral TID WC  . sodium chloride flush  10-40 mL Intracatheter Q12H  . sodium chloride flush  3 mL Intravenous Q12H  . vancomycin  750 mg Intravenous Q24H    Assessment: 83yo male has been on Eliquis for Afib now to transition to  heparin; last dose of Eliquis 2100 3/27; Eliquis will affect anti-Xa levels so will manage w/ aPTT until Eliquis cleared.  AM aPTT elevated, and aPTT/HL still discordant. Pltc down significantly and hgb slightly lower; however, patient is +4.7L. No accurate baseline aPTT to assess, so will hold and restart at lower rate.    Goal of Therapy:  Heparin level 0.3-0.7 units/ml aPTT 66-102 seconds Monitor platelets by anticoagulation protocol: Yes   Plan:  Hold heparin x1 hour Restart heparin at 700 units/hr 8 hour aPTT Monitor daily aptt/heparin level Watch s/sx of bleeding

## 2017-01-14 NOTE — Progress Notes (Signed)
PULMONARY / CRITICAL CARE MEDICINE   Name: Louis Sutton MRN: [id number] DOB: [birth year]    ADMISSION DATE:  2017-01-28 CONSULTATION DATE: 01/13/2017  REFERRING MD:  Dr. Jerral Ralph   CHIEF COMPLAINT:  Chest Pain/Dyspnea   HISTORY OF PRESENT ILLNESS:   83 year old male with PMH of Afib, Asthma, CHF, COPD (On 2L [place]), DM, HTN, Anemia, NSTEMI, ESRD on HD MWF and recent RUE DVT on Eliquis. Presents to ED on 3/27 with reported progressive dyspnea with abdominal and chest pain. Patient was admitted to [facility] on 3/23 and then discharged 3/27 with reported symptoms reoccurring after 6 hours of discharge. Upon arrival to ED patient was alert and oriented complaining of active chest pain - flat troponins, with BP 90-100s Systolic and HR 110-120 in Afib. Admitted to Step-down. Overnight on 3/27 patient went into Afib with RVR, treated with Cardizem and Metoprolol. This morning systolic 60-80 in Afib with HR 110-130, and hypoxic requiring BIPAP. PCCM asked to consult.   Patient had a recent hospital stay from 12/29-1/13 for sepsis secondary to E. Coli Bacteriemia. During this stay patient was found to have an adrenal nodule and RUE DVT in which was treated with Eliquis. After discharge was sent to Rehab and then ultimately assisted living.   SUBJECTIVE:  Off BiPAP, refusing, T max of 102.5 overnight.  VITAL SIGNS: BP (!) 87/19   Pulse 95   Temp (!) 96.2 F (35.7 C) (Oral)   Resp (!) 23   Ht 5\' 9"  (1.753 m)   Wt 79 kg (174 lb 2.6 oz)   SpO2 96%   BMI 25.72 kg/m   HEMODYNAMICS:    VENTILATOR SETTINGS: FiO2 (%):  [30 %-50 %] 30 %  INTAKE / OUTPUT: I/O last 3 completed shifts: In: 5997.1 [P.O.:100; I.V.:5747.1; IV Piggyback:150] Out: 1123 [Other:1123]      PHYSICAL EXAMINATION: General:  Adult male, no distress Neuro:  Lethargic, follows commands, moves all extremities  HEENT:  Normocephalic  Cardiovascular:  Afib, Tachy, NI S1/S2 Lungs:  Rhonchi throughout, no crackles/wheeze,  non-labored  Abdomen:  Active bowel sound, non-tender, distended  Musculoskeletal:  No acute, no edema  Skin:  Warm, dry, intact   LABS:  BMET  Recent Labs Lab 2017-01-28 1144 01/13/17 0641 01/14/17 0320  NA 133* 135 130*  K 3.4* 3.6 4.2  CL 95* 97* 96*  CO2 28 25 15*  BUN 24* 34* 36*  CREATININE 3.51* 4.41* 4.04*  GLUCOSE 171* 75 162*   Electrolytes  Recent Labs Lab January 28, 2017 1144 01/13/17 0641 01/14/17 0320  CALCIUM 9.0 8.8* 8.1*  MG  --   --  1.8  PHOS  --   --  4.0   CBC  Recent Labs Lab 01-28-2017 1144 01/13/17 0053 01/14/17 0320  WBC 12.0* 6.2 30.4*  HGB 9.1* 9.4* 8.2*  HCT 28.7* 30.7* 26.4*  PLT 163 154 110*   Coag's  Recent Labs Lab Jan 28, 2017 1144 01/13/17 1735  APTT  --  128*  INR 1.63  --    Sepsis Markers  Recent Labs Lab 01/13/17 0848 01/13/17 1320 01/14/17 0320  LATICACIDVEN 6.3* 4.0*  --   PROCALCITON 16.83  --  24.92   ABG  Recent Labs Lab 01/13/17 0656  PHART 7.439  PCO2ART 36.2  PO2ART 220*   Liver Enzymes  Recent Labs Lab 01/28/2017 1144 01/14/17 0320  AST 20  --   ALT 15*  --   ALKPHOS 99  --   BILITOT 1.1  --   ALBUMIN 3.2* 2.5*  Cardiac Enzymes  Recent Labs Lab 01/13/17 1200 01/13/17 2123 01/14/17 0000  TROPONINI 1.34* 1.47* 1.04*   Glucose  Recent Labs Lab 01/13/17 1129 01/13/17 1727 01/13/17 2059 01/14/17 0036 01/14/17 0412 01/14/17 0753  GLUCAP 81 154* 158* 163* 161* 133*   Imaging Ct Angio Chest Pe W Or Wo Contrast  Result Date: 01/13/2017 CLINICAL DATA:  Initial evaluation for acute shortness of breath. EXAM: CT ANGIOGRAPHY CHEST WITH CONTRAST TECHNIQUE: Multidetector CT imaging of the chest was performed using the standard protocol during bolus administration of intravenous contrast. Multiplanar CT image reconstructions and MIPs were obtained to evaluate the vascular anatomy. CONTRAST:  100 cc of Isovue 370. COMPARISON:  Prior radiograph from earlier the same day. FINDINGS: Cardiovascular:  Intrathoracic aorta of normal caliber. Extensive plaque throughout the aorta and visualized great vessels. No aneurysm. Fairly pronounced cardiomegaly with diffuse 3 vessel coronary artery calcifications. Trace pericardial fluid noted. Evaluation pulmonary arterial tree somewhat limited by timing of the contrast bolus. Evaluation for possible small distal emboli limited on this exam. Main pulmonary artery within normal limits for size measuring 3 cm in diameter. No large or central filling defect to suggest acute pulmonary embolism identified. Re-formatted imaging confirms these findings. The Mediastinum/Nodes: Visualized thyroid mildly heterogeneous but grossly unremarkable. Mildly enlarged 15 mm right paratracheal lymph node (series 5, image 40). Additional shotty subcentimeter mediastinal nodes noted. No hilar adenopathy. No appreciable axillary adenopathy. Esophagus within normal limits. Lungs/Pleura: Tracheobronchial tree is patent. Layering bilateral pleural effusions. Associated bibasilar opacities likely atelectasis, although superimposed infiltrate could be considered in the correct clinical setting. Diffuse hazy ground-glass opacity within the lungs most likely reflects edema. Underlying paraseptal emphysema. No pneumothorax. No worrisome pulmonary nodule or mass. Upper Abdomen: Small volume ascites present within the partially visualized upper abdomen. At remainder of the partially visualized upper abdomen otherwise unremarkable. Musculoskeletal: Extensive venous collaterals present within knee right chest wall. No acute osseous abnormality. No worrisome lytic or blastic osseous lesions. Multiple remotely healed right-sided rib fractures noted. Review of the MIP images confirms the above findings. IMPRESSION: 1. No large or central pulmonary embolism identified. Please note evaluation for possible distal small emboli somewhat limited on this exam due to timing of the contrast bolus and motion artifact. 2.  Cardiomegaly with layering bilateral pleural effusions. Diffuse ground-glass opacity involving the bilateral lungs most likely reflects pulmonary edema. 3. Superimposed bibasilar airspace opacities, likely atelectasis, although superimposed infiltrate could be considered in the correct clinical setting. 4. Underlying emphysema. 5. Ascites within the visualized upper abdomen. 6. Advanced aortic atherosclerosis with diffuse 3 vessel coronary artery calcifications. Electronically Signed   By: Rise Mu M.D.   On: 01/13/2017 17:36   Dg Chest Port 1 View  Result Date: 01/14/2017 CLINICAL DATA:  Shortness of breath, CHF, end-stage renal disease, COPD. EXAM: PORTABLE CHEST 1 VIEW COMPARISON:  Portable chest x-ray dated January 13, 2017 FINDINGS: The lungs are adequately inflated. There is a persistent right pleural effusion laterally and superiorly. There is mild shift of the mediastinum toward the right which is stable. The cardiac silhouette remains enlarged. There is dense calcification in the wall of the thoracic aorta. The pulmonary vascularity is indistinct. There is no pleural effusion. The left internal jugular venous catheter tip projects over the midportion of the SVC. Multiple right posterior rib fractures are again observed. IMPRESSION: Stable appearance of the chest since yesterday's study. CHF with mild interstitial edema. Small right pleural effusion. No pneumothorax. Multiple displaced right posterior rib fractures. Electronically Signed  By: David  [place] M.D.   On: 01/14/2017 07:59   Dg Chest Port 1 View  Result Date: 01/13/2017 CLINICAL DATA:  Encounter for central line placement. Status post unsuccessful attempt to place a central lawn. EXAM: PORTABLE CHEST 1 VIEW COMPARISON:  Chest CT earlier this day, multiple prior radiographs. FINDINGS: The tip of the left internal jugular central venous catheter is in the region of the mid SVC. No pneumothorax. No additional central line is seen.  Cardiomegaly is stable. Small pleural effusions, right greater than left, unchanged. Stable pulmonary vascular engorgement probable edema. No new focal airspace disease. Vascular stent in the region of the left subclavian. IMPRESSION: Unchanged appearance of the chest. Tip of the left central line in the SVC. No pneumothorax. Electronically Signed   By: Rubye Oaks M.D.   On: 01/13/2017 21:04   Dg Chest Port 1 View  Result Date: 01/13/2017 CLINICAL DATA:  Central line care encounter EXAM: PORTABLE CHEST 1 VIEW COMPARISON:  Portable exam 1306 hours compared to 0723 hours FINDINGS: New LEFT jugular line with tip projecting over SVC. LEFT axillary stent again identified. Enlargement of cardiac silhouette with pulmonary vascular congestion. Atherosclerotic calcification aorta. RIGHT basilar atelectasis and small RIGHT pleural effusion. Remaining lungs clear. No pneumothorax. Multiple old RIGHT rib fractures. IMPRESSION: No pneumothorax following LEFT jugular line placement. Aortic atherosclerosis. Persistent mild RIGHT basilar atelectasis and small pleural effusion. Electronically Signed   By: Ulyses Southward M.D.   On: 01/13/2017 13:18   STUDIES:  CXR 3/27 > small right pleural effusion, right lower lobe airspace diease, mild left lower lobe airspace disease and small left effusion, multiple displaced right rib fractures.  CXR 3/28 > Cardiomegaly, aortic atherosclerosis, interstitial and early alveolar pulmonary edema, small pleural effusions/pleural scarring right more then left, right-sided rib fractures ECHO 3/28>  CULTURES: Blood 3/28 >   ANTIBIOTICS: Cefepime 3/27 > 3/28 Vancomycin 3/27 > Zosyn 3/28 >   SIGNIFICANT EVENTS: 3/27 > Presented to ED dyspnea/chest pain   LINES/TUBES: L IJ TLC 3/28>>> R femoral HD 3/29>>>  DISCUSSION 83 year old with recent hospital stay for chest pain/abdominal pain/dyspnea presents to ED on 3/27 for recurrence of symptoms. Went into Afib with RVR with  hypotension (systolic 60-80). Cardiology consulted. Required BIPAP for hypoxia and transfer to ICU on 3/28.   ASSESSMENT / PLAN:  PULMONARY A: Acute on Chronic Hypoxic Respiratory Failure PE vs HCAP vs Volume Overload vs Splinting secondary to Rib FX -CXR revealed right sided rib fractures (unknown sure if new or old) P:   Change BiPAP to PRN CRRT for volume negative Maintain Oxygenation >90 Pulmonary Hygiene  Scheduled Duoneb and Pulmicort  Albuterol PRN   CARDIOVASCULAR A:  Afib with RVR Recent DVT on Eliquis  H/O HTN, HLD Septic shock P:  Cardiology consult appreciated Levophed/vaso/neo as ordered, titrate down now that BP is improved after adding a-line. Cardiac Monitoring  Continue Ammo Gtt Continue Heparin GTT Hold home Norvasc  Maintain MAP >65   RENAL A:   ESRD on HD MWF  P:   Nephrology consult appreciated CRRT running negative 50 ml/hr Trend BMP Replace electrolytes as needed  GASTROINTESTINAL A:   H/O Cirrhosis  P:   PPI SLP if fails bedside valuation then diet as ordered. Ammonia 36 but will not treat given mental status.  HEMATOLOGIC A:   Anemia  H/O Recent DVT on eliquis  P:  Trend CBC Transfuse for Hbg <7  INFECTIOUS A:   Sepsis from unknown etiology with recent E.Coli Bacteremia  P:   Follow culture data Vancomycin and Zosyn  Trend WBC and fever curve   ENDOCRINE A:   Hypoglycemia  P:   KVO IVF Q4H glucose checks  Hold SSI  NEUROLOGIC A:   Encephalopathic  P:   RASS goal: 0/-1 Hold sedating medications   FAMILY  - Updates: no family at bedside, patient updated bedside.  - Inter-disciplinary family meet or Palliative Care meeting due by:  4/4  The patient is critically ill with multiple organ systems failure and requires high complexity decision making for assessment and support, frequent evaluation and titration of therapies, application of advanced monitoring technologies and extensive interpretation of multiple  databases.   Critical Care Time devoted to patient care services described in this note is  35  Minutes. This time reflects time of care of this signee Dr Koren Bound. This critical care time does not reflect procedure time, or teaching time or supervisory time of PA/NP/Med student/Med Resident etc but could involve care discussion time.  Alyson Reedy, M.D. [facility] Pulmonary/Critical Care Medicine. Pager: [phone]. After hours pager: [phone].

## 2017-01-14 NOTE — Progress Notes (Signed)
Progress Note  Patient Name: Louis Sutton Date of Encounter: 01/14/2017  Primary Cardiologist: New to [facility], Dr [place]; Followed by Dr. Rockne Menghini in [place], [place]  Subjective   Pt is on CRRT. Complains of coughing and rib pain.  Inpatient Medications    Scheduled Meds: . atorvastatin  20 mg Oral q1800  . budesonide (PULMICORT) nebulizer solution  0.25 mg Nebulization BID  . chlorhexidine  15 mL Mouth Rinse BID  . Chlorhexidine Gluconate Cloth  6 each Topical Q0600  . darbepoetin (ARANESP) injection - NON-DIALYSIS  150 mcg Subcutaneous Q Thu-1800  . Folic Acid-Vit B6-Vit B12  1 tablet Oral Daily  . hydrocortisone sodium succinate  50 mg Intravenous Q6H  . ipratropium-albuterol  3 mL Nebulization Q6H  . mouth rinse  15 mL Mouth Rinse q12n4p  . pantoprazole (PROTONIX) IV  40 mg Intravenous Q24H  . piperacillin-tazobactam  3.375 g Intravenous Q6H  . sevelamer carbonate  800 mg Oral TID WC  . sodium chloride flush  10-40 mL Intracatheter Q12H  . sodium chloride flush  3 mL Intravenous Q12H  . vancomycin  750 mg Intravenous Q24H   Continuous Infusions: . amiodarone 30 mg/hr (01/14/17 1000)  . dextrose 50 mL/hr at 01/14/17 1000  . heparin    . norepinephrine 35 mcg/min (01/14/17 1026)  . phenylephrine (NEO-SYNEPHRINE) Adult infusion 100 mcg/min (01/14/17 1000)  . dialysis replacement fluid (prismasate) 300 mL/hr at 01/14/17 1026  . dialysis replacement fluid (prismasate) 300 mL/hr at 01/14/17 0015  . dialysate (PRISMASATE) 1,000 mL/hr at 01/14/17 0504  . vasopressin (PITRESSIN) infusion - *FOR SHOCK* 0.03 Units/min (01/14/17 1000)   PRN Meds: sodium chloride, acetaminophen, albuterol, calcium carbonate (dosed in mg elemental calcium), camphor-menthol **AND** hydrOXYzine, docusate sodium, fentaNYL (SUBLIMAZE) injection, heparin, ondansetron (ZOFRAN) IV, sodium chloride, sodium chloride flush, sodium chloride flush, sorbitol   Vital Signs    Vitals:   01/14/17 0600 01/14/17  0700 01/14/17 0859 01/14/17 0912  BP:      Pulse: 91 95    Resp: 15 (!) 23    Temp:  (!) 96.2 F (35.7 C)    TempSrc:  Oral    SpO2: 99% 100% 96% 96%  Weight:      Height:        Intake/Output Summary (Last 24 hours) at 01/14/17 1124 Last data filed at 01/14/17 1100  Gross per 24 hour  Intake          6387.02 ml  Output             1939 ml  Net          4448.02 ml   Filed Weights   29-Jan-2017 1119 01-29-17 1848 01/14/17 0500  Weight: 171 lb (77.6 kg) 160 lb 9.6 oz (72.8 kg) 174 lb 2.6 oz (79 kg)    Telemetry    Atrial fibrillation with rates in the 90's - Personally Reviewed  ECG    No new  Physical Exam   GEN: No acute distress. On dialysis  Neck: + JVD to jaw, left IJ line in place Cardiac: IRRR, no murmurs, rubs, or gallops.  Respiratory: bilateral crackles GI: Soft, nontender, non-distended  MS: 1+ edema; No deformity. Neuro:  Nonfocal  Psych: Normal affect   Labs    Chemistry Recent Labs Lab 01/29/2017 1144 01/13/17 0641 01/14/17 0320  NA 133* 135 130*  K 3.4* 3.6 4.2  CL 95* 97* 96*  CO2 28 25 15*  GLUCOSE 171* 75 162*  BUN 24* 34* 36*  CREATININE 3.51* 4.41* 4.04*  CALCIUM 9.0 8.8* 8.1*  PROT 7.4  --   --   ALBUMIN 3.2*  --  2.5*  AST 20  --   --   ALT 15*  --   --   ALKPHOS 99  --   --   BILITOT 1.1  --   --   GFRNONAA 15* 11* 13*  GFRAA 17* 13* 15*  ANIONGAP 10 13 19*     Hematology Recent Labs Lab 02/02/2017 1144 01/13/17 0053 01/14/17 0320  WBC 12.0* 6.2 30.4*  RBC 2.75* 2.95* 2.63*  HGB 9.1* 9.4* 8.2*  HCT 28.7* 30.7* 26.4*  MCV 104.4* 104.1* 100.4*  MCH 33.1 31.9 31.2  MCHC 31.7 30.6 31.1  RDW 18.5* 18.4* 18.3*  PLT 163 154 110*    Cardiac Enzymes Recent Labs Lab 01/13/17 0641 01/13/17 1200 01/13/17 2123 01/14/17 0000  TROPONINI 0.38* 1.34* 1.47* 1.04*   No results for input(s): TROPIPOC in the last 168 hours.   BNP Recent Labs Lab 02/02/2017 2152  BNP 2,371.7*     DDimer  Recent Labs Lab 01/13/17 1200    DDIMER 8.19*     Radiology    Dg Chest 2 View  Result Date: February 02, 2017 CLINICAL DATA:  Chest pain.  Dialysis patient EXAM: CHEST  2 VIEW COMPARISON:  None. FINDINGS: Mild cardiac enlargement.  Negative for edema. Small right pleural effusion. Right lower lobe airspace disease could represent atelectasis or infiltrate. Mild left lower lobe airspace disease and small left effusion. Apical pleural scarring bilaterally.  Left subclavian stent. Dilated colon compatible with adynamic ileus. Multiple displaced right rib fractures, possibly acute. IMPRESSION: Cardiac enlargement with mild vascular congestion Small bilateral effusions and bibasilar atelectasis/infiltrate. Findings suggest fluid overload. Colonic ileus Multiple displaced right rib fractures, possibly acute Electronically Signed   By: Marlan Palau M.D.   On: 02/02/2017 12:34   Ct Angio Chest Pe W Or Wo Contrast  Result Date: 01/13/2017 CLINICAL DATA:  Initial evaluation for acute shortness of breath. EXAM: CT ANGIOGRAPHY CHEST WITH CONTRAST TECHNIQUE: Multidetector CT imaging of the chest was performed using the standard protocol during bolus administration of intravenous contrast. Multiplanar CT image reconstructions and MIPs were obtained to evaluate the vascular anatomy. CONTRAST:  100 cc of Isovue 370. COMPARISON:  Prior radiograph from earlier the same day. FINDINGS: Cardiovascular: Intrathoracic aorta of normal caliber. Extensive plaque throughout the aorta and visualized great vessels. No aneurysm. Fairly pronounced cardiomegaly with diffuse 3 vessel coronary artery calcifications. Trace pericardial fluid noted. Evaluation pulmonary arterial tree somewhat limited by timing of the contrast bolus. Evaluation for possible small distal emboli limited on this exam. Main pulmonary artery within normal limits for size measuring 3 cm in diameter. No large or central filling defect to suggest acute pulmonary embolism identified. Re-formatted  imaging confirms these findings. The Mediastinum/Nodes: Visualized thyroid mildly heterogeneous but grossly unremarkable. Mildly enlarged 15 mm right paratracheal lymph node (series 5, image 40). Additional shotty subcentimeter mediastinal nodes noted. No hilar adenopathy. No appreciable axillary adenopathy. Esophagus within normal limits. Lungs/Pleura: Tracheobronchial tree is patent. Layering bilateral pleural effusions. Associated bibasilar opacities likely atelectasis, although superimposed infiltrate could be considered in the correct clinical setting. Diffuse hazy ground-glass opacity within the lungs most likely reflects edema. Underlying paraseptal emphysema. No pneumothorax. No worrisome pulmonary nodule or mass. Upper Abdomen: Small volume ascites present within the partially visualized upper abdomen. At remainder of the partially visualized upper abdomen otherwise unremarkable. Musculoskeletal: Extensive venous collaterals present within knee right chest  wall. No acute osseous abnormality. No worrisome lytic or blastic osseous lesions. Multiple remotely healed right-sided rib fractures noted. Review of the MIP images confirms the above findings. IMPRESSION: 1. No large or central pulmonary embolism identified. Please note evaluation for possible distal small emboli somewhat limited on this exam due to timing of the contrast bolus and motion artifact. 2. Cardiomegaly with layering bilateral pleural effusions. Diffuse ground-glass opacity involving the bilateral lungs most likely reflects pulmonary edema. 3. Superimposed bibasilar airspace opacities, likely atelectasis, although superimposed infiltrate could be considered in the correct clinical setting. 4. Underlying emphysema. 5. Ascites within the visualized upper abdomen. 6. Advanced aortic atherosclerosis with diffuse 3 vessel coronary artery calcifications. Electronically Signed   By: Rise Mu M.D.   On: 01/13/2017 17:36   Dg Chest Port  1 View  Result Date: 01/14/2017 CLINICAL DATA:  Shortness of breath, CHF, end-stage renal disease, COPD. EXAM: PORTABLE CHEST 1 VIEW COMPARISON:  Portable chest x-ray dated January 13, 2017 FINDINGS: The lungs are adequately inflated. There is a persistent right pleural effusion laterally and superiorly. There is mild shift of the mediastinum toward the right which is stable. The cardiac silhouette remains enlarged. There is dense calcification in the wall of the thoracic aorta. The pulmonary vascularity is indistinct. There is no pleural effusion. The left internal jugular venous catheter tip projects over the midportion of the SVC. Multiple right posterior rib fractures are again observed. IMPRESSION: Stable appearance of the chest since yesterday's study. CHF with mild interstitial edema. Small right pleural effusion. No pneumothorax. Multiple displaced right posterior rib fractures. Electronically Signed   By: David  [place] M.D.   On: 01/14/2017 07:59   Dg Chest Port 1 View  Result Date: 01/13/2017 CLINICAL DATA:  Encounter for central line placement. Status post unsuccessful attempt to place a central lawn. EXAM: PORTABLE CHEST 1 VIEW COMPARISON:  Chest CT earlier this day, multiple prior radiographs. FINDINGS: The tip of the left internal jugular central venous catheter is in the region of the mid SVC. No pneumothorax. No additional central line is seen. Cardiomegaly is stable. Small pleural effusions, right greater than left, unchanged. Stable pulmonary vascular engorgement probable edema. No new focal airspace disease. Vascular stent in the region of the left subclavian. IMPRESSION: Unchanged appearance of the chest. Tip of the left central line in the SVC. No pneumothorax. Electronically Signed   By: Rubye Oaks M.D.   On: 01/13/2017 21:04   Dg Chest Port 1 View  Result Date: 01/13/2017 CLINICAL DATA:  Central line care encounter EXAM: PORTABLE CHEST 1 VIEW COMPARISON:  Portable exam 1306 hours  compared to 0723 hours FINDINGS: New LEFT jugular line with tip projecting over SVC. LEFT axillary stent again identified. Enlargement of cardiac silhouette with pulmonary vascular congestion. Atherosclerotic calcification aorta. RIGHT basilar atelectasis and small RIGHT pleural effusion. Remaining lungs clear. No pneumothorax. Multiple old RIGHT rib fractures. IMPRESSION: No pneumothorax following LEFT jugular line placement. Aortic atherosclerosis. Persistent mild RIGHT basilar atelectasis and small pleural effusion. Electronically Signed   By: Ulyses Southward M.D.   On: 01/13/2017 13:18   Dg Chest Port 1 View  Result Date: 01/13/2017 CLINICAL DATA:  Shortness of breath tonight. Decreased oxygen saturation. EXAM: PORTABLE CHEST 1 VIEW COMPARISON:  Radiographs 12/26/2016 FINDINGS: Progressive cardiomegaly. Atherosclerosis of the thoracic aorta. Increasing interstitial opacities suspicious for pulmonary edema. Increased bilateral pleural effusions. Worsening bibasilar opacities are likely atelectasis. No evidence of pneumothorax. Multiple right rib fractures again seen. IMPRESSION: Progressive cardiomegaly with increasing interstitial  opacities suspicious for pulmonary edema. Increasing bilateral pleural effusions. Overall findings suggest fluid overload/CHF. Electronically Signed   By: Rubye Oaks M.D.   On: 01/13/2017 01:19   Dg Chest Port 1v Same Day  Result Date: 01/13/2017 CLINICAL DATA:  Shortness of breath. Hypertension. COPD. Congestive heart failure. EXAM: PORTABLE CHEST 1 VIEW COMPARISON:  Earlier same day FINDINGS: Similar appearance. Cardiomegaly. Aortic atherosclerosis. Interstitial and early alveolar pulmonary edema. Small pleural effusions/pleural scarring right more than left. Right-sided rib fractures as seen previously, age indeterminate. IMPRESSION: No change. Cardiomegaly. Interstitial and early alveolar edema. Effusion/pleural scarring right more than left. Rib fractures on the right,  age indeterminate. Electronically Signed   By: Paulina Fusi M.D.   On: 01/13/2017 07:40    Cardiac Studies   Echo pending  Patient Profile     83 y.o. male with past medical history of COPD (on 2L [place]), ESRD (on MWF), HTN, HLD, Type 2 DM, and chronic atrial fibrillation (on Eliquis) who presented to [facility] on 01/02/2017 for evaluation of chest pain. He was transferred to [facility] and we have consulted for chest pain and elevated troponin.  Assessment & Plan    1. Chest Pain/Elevated Troponin - recently admitted at [facility] for chest pain. CXR shows possible rib fractures.  - denies any prior known history of CAD or MI's. Was admitted in 09/2016 for E.coli bacteremia and had a Type 2 NSTEMI with peak troponin of 1.76 by review of Care Everywhere. Echocardiogram showed a preserved EF of 55-60%, mild MR, and moderate TR. Patient reports having a "normal" stress test just two weeks ago.  - troponin values have been 0.03, 0.05, 0.06, 0.38, 1.34, 1.47, 1.04. Peaked at 1.47 and then down to 1.04. Most likely demand ischemia in setting of sepsis, respiratory failure, ESRD, and afib with RVR. - repeat echocardiogram is pending to assess LV function and wall motion. Would not pursue further ischemic testing at this time in the setting of his acute illness. Will obtain records from his Primary Cardiologist in [place], [place].  -CTA chest showed cardiomegaly with layering bilateral pleural effusions. Diffuse ground-glass opacity involving the bilateral lungs most likely reflects pulmonary edema. Advanced aortic atherosclerosis with diffuse 3 vessel coronary artery calcifications  2. Atrial Fibrillation with RVR - has chronic atrial fibrillation by review of records. Elevated rate due to multiple stressors of sepsis, fever, hypotension, acute respiratory failure. Rates now in the 90's. - This patients CHA2DS2-VASc Score and unadjusted Ischemic Stroke Rate (% per year) is equal to 9.7 % stroke  rate/year from a score of 6 (HTN, DM, Age (2), TE(2)). On Eliquis prior to admission. Has been switched to IV Heparin. Eliquis is not advisable in ESRD. Would consider coumadin for anticoagulation once able to take po's and no invasive procedures planned.  - became hypotensive with IV Lopressor and IV Cardizem. Has been switched to IV Amiodarone. Only PTA cardiac medications include ASA, Eliquis, and Amlodipine. Would not prefer Amiodarone in the long-term setting with his known lung disease. Hopefully can use beta blocker once hypotension is resolved.  -Is on Levophed and Neo for BP support.   3. Acute Hypoxic Respiratory Failure/ COPD - on 2L [place] at home. O2 saturations in the 80's requiring BiPAP. Was weaned off BiPAP but today pt requested to be put back on.  - CTA showed no large or central PE but could not rule out distal small emboli.Marland Kitchen Has been on Eliquis since at least 09/2016 due to an acute DVT  at that time.   4. Sepsis - febrile up to 102.5 yesterday, now has ben afebrile since yesterday afternoon. WBCs up to 30k. Klebsiella bacteremia. Is now on Vanc and Zosyn - Critical Care following. Levophed for hemodynamic support.   5. ESRD - on HD MWF. Too unstable for HD -CRRT has been started.  - Nephrology is following.   6. Anemia Hgb 7.9 at outpatient HD. Hgb 8.2 today. Nephrology plans to transfuse if Hgb <7.  Signed, Berton Bon, NP  01/14/2017, 11:24 AM    Patient seen and examined and history reviewed. Agree with above findings and plan. Patient is critically ill with sepsis. Multiple gram negative organisms. Requiring pressors for support. On IV amiodarone for rate control and rate is satisfactory. Awaiting Echo results. I think mild troponin elevation is due to demand ischemia in setting of septic shock and ESRD.   Sheniqua Carolan [place], MDFACC 01/14/2017 12:24 PM

## 2017-01-14 NOTE — Progress Notes (Signed)
eLink Physician-Brief Progress Note Patient Name: Louis Sutton DOB: 24-May-1932 MRN: [id number]   Date of Service  01/14/2017  HPI/Events of Note  Anxiety   eICU Interventions  Will order: 1. Xanax 0.25 mg PO X 1.      Intervention Category Minor Interventions: Agitation / anxiety - evaluation and management  Weston Fulco Eugene 01/14/2017, 9:15 PM

## 2017-01-14 NOTE — Progress Notes (Signed)
PT Cancellation Note  Patient Details Name: Louis Sutton MRN: [id number] DOB: 1932/01/18   Cancelled Treatment:    Reason Eval/Treat Not Completed: Medical issues which prohibited therapy.  Pt with significant decline in medical status since PT eval ordered. Pt now has a temporary femoral HD catheter. PT signing off. Please re-consult if med status improves. Ilda Foil 01/14/2017, 8:48 AM

## 2017-01-14 NOTE — Progress Notes (Signed)
OT Cancellation Note  Patient Details Name: Louis Sutton MRN: [id number] DOB: 04-Apr-1932   Cancelled Treatment:    Reason Eval/Treat Not Completed: Medical issues which prohibited therapy. Pt had hemodialysis catheter placed yesterday, will not be able to evaluate until after this is D/C'd.  Evette Georges [phone] 01/14/2017, 7:55 AM

## 2017-01-14 NOTE — Progress Notes (Signed)
Patient tolerating BIPAP well at this time. No respiratory distress noted. RT will continue to monitor.

## 2017-01-14 NOTE — Progress Notes (Signed)
CRITICAL VALUE ALERT  Critical value received:  PTT 163  Date of notification:  3/29  Time of notification:  0910  Critical value read back: yes  Nurse who received alert:  Edilia Bo  MD notified (1st page): present, Dr. Molli Knock  Time of first page:  present  MD notified (2nd page):   Time of second page:  Responding MD:  Molli Knock  Time MD responded:  present

## 2017-01-15 ENCOUNTER — Inpatient Hospital Stay (HOSPITAL_COMMUNITY): Payer: Medicare Other

## 2017-01-15 ENCOUNTER — Encounter (HOSPITAL_COMMUNITY): Payer: Medicare Other

## 2017-01-15 DIAGNOSIS — I519 Heart disease, unspecified: Secondary | ICD-10-CM

## 2017-01-15 DIAGNOSIS — J9620 Acute and chronic respiratory failure, unspecified whether with hypoxia or hypercapnia: Secondary | ICD-10-CM

## 2017-01-15 DIAGNOSIS — R071 Chest pain on breathing: Secondary | ICD-10-CM

## 2017-01-15 DIAGNOSIS — R6521 Severe sepsis with septic shock: Secondary | ICD-10-CM

## 2017-01-15 LAB — GLUCOSE, CAPILLARY
GLUCOSE-CAPILLARY: 136 mg/dL — AB (ref 65–99)
GLUCOSE-CAPILLARY: 142 mg/dL — AB (ref 65–99)
GLUCOSE-CAPILLARY: 93 mg/dL (ref 65–99)
Glucose-Capillary: 91 mg/dL (ref 65–99)
Glucose-Capillary: 41 mg/dL — CL (ref 65–99)
Glucose-Capillary: 94 mg/dL (ref 65–99)
Glucose-Capillary: 148 mg/dL — ABNORMAL HIGH (ref 65–99)
Glucose-Capillary: 104 mg/dL — ABNORMAL HIGH (ref 65–99)

## 2017-01-15 LAB — RENAL FUNCTION PANEL
ALBUMIN: 2.6 g/dL — AB (ref 3.5–5.0)
ALBUMIN: 2.2 g/dL — AB (ref 3.5–5.0)
Anion gap: 27 — ABNORMAL HIGH (ref 5–15)
Anion gap: 21 — ABNORMAL HIGH (ref 5–15)
BUN: 24 mg/dL — AB (ref 6–20)
BUN: 23 mg/dL — AB (ref 6–20)
CALCIUM: 8.7 mg/dL — AB (ref 8.9–10.3)
CO2: 10 mmol/L — ABNORMAL LOW (ref 22–32)
CO2: 12 mmol/L — ABNORMAL LOW (ref 22–32)
CREATININE: 2.4 mg/dL — AB (ref 0.61–1.24)
CREATININE: 2.01 mg/dL — AB (ref 0.61–1.24)
Calcium: 7.9 mg/dL — ABNORMAL LOW (ref 8.9–10.3)
Chloride: 97 mmol/L — ABNORMAL LOW (ref 101–111)
Chloride: 98 mmol/L — ABNORMAL LOW (ref 101–111)
GFR calc Af Amer: 27 mL/min — ABNORMAL LOW (ref >60)
GFR, EST AFRICAN AMERICAN: 34 mL/min — AB (ref >60)
GFR, EST NON AFRICAN AMERICAN: 24 mL/min — AB (ref >60)
GFR, EST NON AFRICAN AMERICAN: 29 mL/min — AB (ref >60)
GLUCOSE: 94 mg/dL (ref 65–99)
Glucose, Bld: 129 mg/dL — ABNORMAL HIGH (ref 65–99)
PHOSPHORUS: 4.4 mg/dL (ref 2.5–4.6)
PHOSPHORUS: 4.2 mg/dL (ref 2.5–4.6)
POTASSIUM: 4.7 mmol/L (ref 3.5–5.1)
Potassium: 4.7 mmol/L (ref 3.5–5.1)
SODIUM: 134 mmol/L — AB (ref 135–145)
Sodium: 131 mmol/L — ABNORMAL LOW (ref 135–145)

## 2017-01-15 LAB — POCT I-STAT 3, ART BLOOD GAS (G3+)
ACID-BASE DEFICIT: 18 mmol/L — AB (ref 0.0–2.0)
Acid-base deficit: 19 mmol/L — ABNORMAL HIGH (ref 0.0–2.0)
BICARBONATE: 10.3 mmol/L — AB (ref 20.0–28.0)
BICARBONATE: 9.1 mmol/L — AB (ref 20.0–28.0)
O2 SAT: 100 %
O2 SAT: 97 %
PCO2 ART: 22.2 mmHg — AB (ref 32.0–48.0)
PO2 ART: 96 mmHg (ref 83.0–108.0)
Patient temperature: 35.1
TCO2: 11 mmol/L (ref 0–100)
TCO2: 10 mmol/L (ref 0–100)
pCO2 arterial: 33.1 mmHg (ref 32.0–48.0)
pH, Arterial: 7.084 — CL (ref 7.350–7.450)
pH, Arterial: 7.211 — ABNORMAL LOW (ref 7.350–7.450)
pO2, Arterial: 408 mmHg — ABNORMAL HIGH (ref 83.0–108.0)

## 2017-01-15 LAB — CULTURE, BLOOD (ROUTINE X 2): Culture: >=100000 — AB

## 2017-01-15 LAB — CBC
HCT: 25.3 % — ABNORMAL LOW (ref 39.0–52.0)
HEMOGLOBIN: 7.8 g/dL — AB (ref 13.0–17.0)
MCH: 31.3 pg (ref 26.0–34.0)
MCHC: 30.8 g/dL (ref 30.0–36.0)
MCV: 101.6 fL — AB (ref 78.0–100.0)
PLATELETS: 65 10*3/uL — AB (ref 150–400)
RBC: 2.49 MIL/uL — AB (ref 4.22–5.81)
RDW: 18.7 % — ABNORMAL HIGH (ref 11.5–15.5)
WBC: 29.5 10*3/uL — AB (ref 4.0–10.5)

## 2017-01-15 LAB — LACTATE DEHYDROGENASE: LDH: 1098 U/L — ABNORMAL HIGH (ref 98–192)

## 2017-01-15 LAB — MAGNESIUM
MAGNESIUM: 2.4 mg/dL (ref 1.7–2.4)
MAGNESIUM: 2.1 mg/dL (ref 1.7–2.4)
Magnesium: 2.4 mg/dL (ref 1.7–2.4)

## 2017-01-15 LAB — HEPARIN LEVEL (UNFRACTIONATED): HEPARIN UNFRACTIONATED: 0.81 [IU]/mL — AB (ref 0.30–0.70)

## 2017-01-15 LAB — PHOSPHORUS
PHOSPHORUS: 5.4 mg/dL — AB (ref 2.5–4.6)
Phosphorus: 4.1 mg/dL (ref 2.5–4.6)

## 2017-01-15 LAB — APTT: aPTT: 96 seconds — ABNORMAL HIGH (ref 24–36)

## 2017-01-15 LAB — LACTIC ACID, PLASMA
LACTIC ACID, VENOUS: 7.9 mmol/L — AB (ref 0.5–1.9)
Lactic Acid, Venous: 12.8 mmol/L (ref 0.5–1.9)

## 2017-01-15 LAB — PROCALCITONIN: Procalcitonin: 14.23 ng/mL

## 2017-01-15 MED ORDER — FENTANYL CITRATE (PF) 100 MCG/2ML IJ SOLN
100.0000 ug | Freq: Once | INTRAMUSCULAR | Status: AC
  Administered 2017-01-15: 100 ug via INTRAVENOUS

## 2017-01-15 MED ORDER — CHLORHEXIDINE GLUCONATE 0.12% ORAL RINSE (MEDLINE KIT)
15.0000 mL | Freq: Two times a day (BID) | OROMUCOSAL | Status: DC
  Administered 2017-01-15 – 2017-01-16 (×3): 15 mL via OROMUCOSAL

## 2017-01-15 MED ORDER — ETOMIDATE 2 MG/ML IV SOLN
20.0000 mg | Freq: Once | INTRAVENOUS | Status: AC
  Administered 2017-01-15: 20 mg via INTRAVENOUS

## 2017-01-15 MED ORDER — NEPRO/CARBSTEADY PO LIQD
1000.0000 mL | ORAL | Status: DC
  Administered 2017-01-15: 1000 mL
  Filled 2017-01-15 (×3): qty 1000

## 2017-01-15 MED ORDER — DEXTROSE 50 % IV SOLN
INTRAVENOUS | Status: AC
  Filled 2017-01-15: qty 50

## 2017-01-15 MED ORDER — VITAL HIGH PROTEIN PO LIQD: 1000.0000 mL | ORAL | Status: DC

## 2017-01-15 MED ORDER — DEXTROSE 50 % IV SOLN
50.0000 mL | Freq: Once | INTRAVENOUS | Status: AC
  Administered 2017-01-15: 50 mL via INTRAVENOUS

## 2017-01-15 MED ORDER — MIDAZOLAM HCL 2 MG/2ML IJ SOLN
2.0000 mg | Freq: Once | INTRAMUSCULAR | Status: AC
  Administered 2017-01-15: 2 mg via INTRAVENOUS

## 2017-01-15 MED ORDER — MIDAZOLAM HCL 2 MG/2ML IJ SOLN
INTRAMUSCULAR | Status: AC
  Administered 2017-01-15: 2 mg via INTRAVENOUS
  Filled 2017-01-15: qty 2

## 2017-01-15 MED ORDER — IOPAMIDOL (ISOVUE-300) INJECTION 61%
INTRAVENOUS | Status: AC
  Administered 2017-01-15: 30 mL
  Filled 2017-01-15: qty 100

## 2017-01-15 MED ORDER — DEXTROSE 5 % IV SOLN
2.0000 g | INTRAVENOUS | Status: DC
  Administered 2017-01-15: 2 g via INTRAVENOUS
  Filled 2017-01-15 (×2): qty 2

## 2017-01-15 MED ORDER — ORAL CARE MOUTH RINSE
15.0000 mL | OROMUCOSAL | Status: DC
  Administered 2017-01-15 – 2017-01-16 (×9): 15 mL via OROMUCOSAL

## 2017-01-15 MED ORDER — PRO-STAT SUGAR FREE PO LIQD
60.0000 mL | Freq: Four times a day (QID) | ORAL | Status: DC
  Administered 2017-01-15 – 2017-01-16 (×3): 60 mL
  Filled 2017-01-15 (×3): qty 60

## 2017-01-15 MED ORDER — FENTANYL CITRATE (PF) 100 MCG/2ML IJ SOLN
INTRAMUSCULAR | Status: AC
  Administered 2017-01-15: 100 ug via INTRAVENOUS
  Filled 2017-01-15: qty 2

## 2017-01-15 MED ORDER — PRO-STAT SUGAR FREE PO LIQD: 30.0000 mL | Freq: Two times a day (BID) | ORAL | Status: DC

## 2017-01-15 MED ORDER — IOPAMIDOL (ISOVUE-300) INJECTION 61%
INTRAVENOUS | Status: AC
  Administered 2017-01-15: 100 mL
  Filled 2017-01-15: qty 30

## 2017-01-15 NOTE — Progress Notes (Signed)
Patient HR trending up to 120-130s, but not sustaining. Pola Corn, MD called and notified. No new orders given at this time.

## 2017-01-15 NOTE — Plan of Care (Signed)
Problem: Nutrition: Goal: Adequate nutrition will be maintained Outcome: Progressing Pt started on tube feedings

## 2017-01-15 NOTE — Progress Notes (Signed)
Nutrition Follow-up  DOCUMENTATION CODES:   Non-severe (moderate) malnutrition in context of chronic illness  INTERVENTION:   D/C Vital High Protein  Nepro @ 20 ml/hr (480 ml/day) 60 ml Prostat QID Provides: 1664 kcal, 158 grams protein, and 348 ml free water.   NUTRITION DIAGNOSIS:   Malnutrition related to chronic illness as evidenced by percent weight loss, moderate depletions of muscle mass, mild depletion of muscle mass. Ongoing.   GOAL:   Patient will meet greater than or equal to 90% of their needs Progressing.   MONITOR:   Diet advancement, Labs, Weight trends  REASON FOR ASSESSMENT:   Consult Enteral/tube feeding initiation and management  ASSESSMENT:   83 yo male admitted with chest pain/elevated troponin; hx of ESRD on HD, HTN, HLD, DM, COPD, NSTEMI, CHF  Patient is currently intubated on ventilator support MV: 9.8 L/min Temp (24hrs), Avg:94.8 F (34.9 C), Min:92.4 F (33.6 C), Max:97.5 F (36.4 C)  Medications reviewed and include: Folbee, solu-cortef, D5 @ 10 ml/hr Phenylephrine now off, vasopressin @11 .3, norepinephrine @ 37.5 Labs reviewed: Na 134, K+, PO4, Magnesium are all WNL MAP 62, 52, 58, 61 EDW: 70 kg CRRT started 3/28  Adult TF protocol ordered, TF not yet started Per RN, TF not yet started as pt is having a CT of abd first  Diet Order:  Diet NPO time specified  Skin:  Reviewed, no issues  Last BM:  no documented BM  Height:   Ht Readings from Last 1 Encounters:  01/06/2017 5\' 9"  (1.753 m)    Weight:   Wt Readings from Last 1 Encounters:  01/15/17 167 lb 1.7 oz (75.8 kg)    Ideal Body Weight:  72.7 kg  BMI:  Body mass index is 24.68 kg/m.  Estimated Nutritional Needs:   Kcal:  1511  Protein:  140-175 grams  Fluid:  1000 mL plus UOP  EDUCATION NEEDS:   No education needs identified at this time  Kendell Bane RD, LDN, CNSC [phone] Pager [phone] After Hours Pager

## 2017-01-15 NOTE — Procedures (Signed)
Intubation Procedure Note Morey Andonian [id number] 1932-03-08  Procedure: Intubation Indications: Respiratory insufficiency  Procedure Details Consent: Unable to obtain consent because of emergent medical necessity. Time Out: Verified patient identification, verified procedure, site/side was marked, verified correct patient position, special equipment/implants available, medications/allergies/relevent history reviewed, required imaging and test results available.  Performed  Maximum sterile technique was used including antiseptics, cap, gloves, gown, hand hygiene and mask.  MAC and 4    Evaluation Hemodynamic Status: BP stable throughout; O2 sats: stable throughout Patient's Current Condition: stable Complications: No apparent complications Patient did tolerate procedure well. Chest X-ray ordered to verify placement.  CXR: pending.   Raylene Miyamoto 01/15/2017  Crusty secretions in airway

## 2017-01-15 NOTE — Progress Notes (Signed)
CRITICAL VALUE ALERT  Critical value received:  Lactic Acid 12.8  Date of notification:  01/15/17  Time of notification:  1055  Critical value read back:Yes.    Nurse who received alert:  Jerrye Bushy RN and Bronson Curb RN  MD notified (1st page):  Dr. Tyson Alias  Time of first page:  1115  Verbal orders given for draw LDH, and obtain abd/pelvis and head CT.

## 2017-01-15 NOTE — Progress Notes (Signed)
PULMONARY / CRITICAL CARE MEDICINE   Name: Rachard Krupka MRN: [id number] DOB: 1931-11-30    ADMISSION DATE:  Jan 22, 2017 CONSULTATION DATE: 01/13/2017  REFERRING MD:  Dr. Jerral Ralph   CHIEF COMPLAINT:  Chest Pain/Dyspnea   HISTORY OF PRESENT ILLNESS:   83 year old male with PMH of Afib, Asthma, CHF, COPD (On 2L [place]), DM, HTN, Anemia, NSTEMI, ESRD on HD MWF and recent RUE DVT on Eliquis. Presents to ED on 3/27 with reported progressive dyspnea with abdominal and chest pain. Patient was admitted to [facility] on 3/23 and then discharged 3/27 with reported symptoms reoccurring after 6 hours of discharge. Upon arrival to ED patient was alert and oriented complaining of active chest pain - flat troponins, with BP 90-100s Systolic and HR 110-120 in Afib. Admitted to Step-down. Overnight on 3/27 patient went into Afib with RVR, treated with Cardizem and Metoprolol. This morning systolic 60-80 in Afib with HR 110-130, and hypoxic requiring BIPAP. PCCM asked to consult.   Patient had a recent hospital stay from 12/29-1/13 for sepsis secondary to E. Coli Bacteriemia. During this stay patient was found to have an adrenal nodule and RUE DVT in which was treated with Eliquis. After discharge was sent to Rehab and then ultimately assisted living.   SUBJECTIVE:  Back on NIMV Vaso, levo 40  VITAL SIGNS: BP (!) 121/33   Pulse 89   Temp (!) 95.5 F (35.3 C) (Rectal)   Resp (!) 35   Ht 5\' 9"  (1.753 m)   Wt 75.8 kg (167 lb 1.7 oz)   SpO2 (!) 87%   BMI 24.68 kg/m   HEMODYNAMICS:    VENTILATOR SETTINGS: FiO2 (%):  [40 %] 40 %  INTAKE / OUTPUT: I/O last 3 completed shifts: In: 6153.5 [I.V.:5703.5; IV Piggyback:450] Out: 5781 [Other:5781]      PHYSICAL EXAMINATION: General:  Adult male, no distress Neuro:  Lethargic, follows commands HEENT:  Normocephalic  Cardiovascular:  Afib, Tachy, NI S1/S2 Lungs:  Rhonchi  cparse Abdomen:  Active bowel sound, non-tender, distended  Musculoskeletal:   No acute, no edema  Skin:  Warm, dry, intact   LABS:  BMET  Recent Labs Lab 01/14/17 0320 01/14/17 1600 01/15/17 0424  NA 130* 132* 134*  K 4.2 4.3 4.7  CL 96* 98* 97*  CO2 15* 13* 10*  BUN 36* 28* 24*  CREATININE 4.04* 3.01* 2.40*  GLUCOSE 162* 142* 94   Electrolytes  Recent Labs Lab 01/14/17 0320 01/14/17 1600 01/15/17 0424  CALCIUM 8.1* 8.1* 8.7*  MG 1.8  --  2.4  PHOS 4.0 3.7 4.4   CBC  Recent Labs Lab 01/13/17 0053 01/14/17 0320 01/15/17 0424  WBC 6.2 30.4* 29.5*  HGB 9.4* 8.2* 7.8*  HCT 30.7* 26.4* 25.3*  PLT 154 110* 65*   Coag's  Recent Labs Lab 01-22-2017 1144  01/14/17 0900 01/14/17 2015 01/15/17 0424  APTT  --   < > 193* 136* 96*  INR 1.63  --   --   --   --   < > = values in this interval not displayed. Sepsis Markers  Recent Labs Lab 01/13/17 0848 01/13/17 1320 01/14/17 0320 01/15/17 0424  LATICACIDVEN 6.3* 4.0*  --   --   PROCALCITON 16.83  --  24.92 14.23   ABG  Recent Labs Lab 01/13/17 0656  PHART 7.439  PCO2ART 36.2  PO2ART 220*   Liver Enzymes  Recent Labs Lab 2017/01/22 1144 01/14/17 0320 01/14/17 1600 01/15/17 0424  AST 20  --   --   --  ALT 15*  --   --   --   ALKPHOS 99  --   --   --   BILITOT 1.1  --   --   --   ALBUMIN 3.2* 2.5* 2.5* 2.6*   Cardiac Enzymes  Recent Labs Lab 01/13/17 1200 01/13/17 2123 01/14/17 0000  TROPONINI 1.34* 1.47* 1.04*   Glucose  Recent Labs Lab 01/14/17 1142 01/14/17 1549 01/14/17 1938 01/15/17 0008 01/15/17 0401 01/15/17 0802  GLUCAP 138* 134* 145* 93 91 41*   Imaging No results found. STUDIES:  CXR 3/27 > small right pleural effusion, right lower lobe airspace diease, mild left lower lobe airspace disease and small left effusion, multiple displaced right rib fractures.  CXR 3/28 > Cardiomegaly, aortic atherosclerosis, interstitial and early alveolar pulmonary edema, small pleural effusions/pleural scarring right more then left, right-sided rib  fractures ECHO 3/28>  CULTURES: Blood 3/28 >   ANTIBIOTICS: Cefepime 3/27 > 3/28 Vancomycin 3/27 >3/30 Zosyn 3/28 >   SIGNIFICANT EVENTS: 3/27 > Presented to ED dyspnea/chest pain  3/30- shock worsening, NIMV restarted, high pressors  LINES/TUBES: L IJ TLC 3/28>>> R femoral HD 3/29>>>  DISCUSSION 83 year old with recent hospital stay for chest pain/abdominal pain/dyspnea presents to ED on 3/27 for recurrence of symptoms. Went into Afib with RVR with hypotension (systolic 60-80). Cardiology consulted. Required BIPAP for hypoxia and transfer to ICU on 3/28.   ASSESSMENT / PLAN:  PULMONARY A: Acute on Chronic Hypoxic Respiratory Failure PE vs HCAP vs Volume Overload vs Splinting secondary to Rib FX -CXR revealed right sided rib fractures (unknown sure if new or old) P:   Not impressed for PNA Needs emergent ETT Then 7 cc/kg, rate 18, 100%, peep 5, abg to follow pcxr follow up CT no pNA, impressed edema then   CARDIOVASCULAR NEW acute septic cardiomyoapthy likelyA:   Afib with RVR Recent DVT on Eliquis  H/O HTN, HLD Septic shock rel AI P:  No role neo on large levo needs, dc neo, can increase Continue Ammo Gtt Continue Heparin GTT Maintain MAP >60 Vaso okay Stress roids to remain  RENAL A:   ESRD on HD MWF  P:   cvvhd  GASTROINTESTINAL A:   H/O Cirrhosis  P:   PPI ett and feed likely in PM  HEMATOLOGIC A:   Anemia  H/O Recent DVT on eliquis  Thrombocytopenia ( sepsis) P:  Trend CBC Transfuse for Hbg <7 Heparin, hold with drastic drop Duplex upper rt  INFECTIOUS A:   Sepsis from unknown etiology with recent E.Coli Bacteremia  r/o RUE septic thombophlebitiis P:   Dc vanc Zosyn until we get sens kleib Doppler arm May need TEE  ENDOCRINE A:   Hypoglycemia Rel AI  P:   KVO IVF Q4H glucose checks  Hold SSI Stress roids  NEUROLOGIC A:   Encephalopathic septic P:   RASS goal: 0/-1 Will need fent likley after ett  Ccm time 35  min   Mcarthur Rossetti. Tyson Alias, MD, FACP Pgr: [phone] [facility]

## 2017-01-15 NOTE — Progress Notes (Signed)
OT Cancellation Note  Patient Details Name: Louis Sutton MRN: [id number] DOB: Apr 30, 1932   Cancelled Treatment:    Reason Eval/Treat Not Completed: Medical issues which prohibited therapy. Pt with significant decline in medical status. Will sign off. Please reorder as appropriate.  Evern Bio 01/15/2017, 8:19 AM

## 2017-01-15 NOTE — Progress Notes (Signed)
RT transported pt on vent with same settings to and from CT with no complications

## 2017-01-15 NOTE — Progress Notes (Signed)
Family requested to speak with physician.  Reviewed hospital course and compared to prior hospitalization w/ E. Coli bacteremia in December/Jan, which apparently didn't involve respiratory failure. Family states patient never fully recovered from that, and that he has stated he is "tired," which HCPOA interprets as tired of living in poor health, as he is on dialysis, living in ALF/SNF, and has lost a great deal of independence. She states that she has had prior conversations with him about what his goals would be in the case of poor response to critical care, and he has expressed no desire for prolonged mechanical ventilation. He seems to be worsening despite almost a week's worth of treatment directed toward gram-negative sepsis.  We discussed this with the patient, who is awake and answering questions appropriately, and will plan for terminal extubation tomorrow, as well as cessation of pressors. The niece, Luster Landsberg, the [place], requested this. I explained that death may not be imminent, but is difficult to predict what his post-extubation course will be like.  CRITICAL CARE Performed by: Jamie Kato   Total critical care time: 35 minutes  Critical care time was exclusive of separately billable procedures and treating other patients.  Critical care was necessary to treat or prevent imminent or life-threatening deterioration.  Critical care was time spent personally by me on the following activities: development of treatment plan with patient and/or surrogate as well as nursing, discussions with consultants, evaluation of patient's response to treatment, examination of patient, obtaining history from patient or surrogate, ordering and performing treatments and interventions, ordering and review of laboratory studies, ordering and review of radiographic studies, pulse oximetry and re-evaluation of patient's condition.  Jamie Kato, MD Pulmonary & Critical Care Medicine January 15, 2017, 8:28  PM

## 2017-01-15 NOTE — Progress Notes (Signed)
Dr. Eliott Nine with nephrology updated on pt's condition and made aware extra liter will be given via NG tube for the CT contrast. MD verbalized understand and told RN to do the best we can.

## 2017-01-15 NOTE — Progress Notes (Addendum)
Progress Note  Patient Name: Louis Sutton Date of Encounter: 01/15/2017  Primary Cardiologist: New to [facility], Dr [place]; Followed by Dr. Rockne Menghini in [place], [place]  Subjective   Pt is on CRRT. Poorly responsive today. On BIPAP. Nods to questions.  Inpatient Medications    Scheduled Meds: . atorvastatin  20 mg Oral q1800  . budesonide (PULMICORT) nebulizer solution  0.25 mg Nebulization BID  . chlorhexidine  15 mL Mouth Rinse BID  . Chlorhexidine Gluconate Cloth  6 each Topical Q0600  . darbepoetin (ARANESP) injection - NON-DIALYSIS  150 mcg Subcutaneous Q Thu-1800  . feeding supplement (PRO-STAT SUGAR FREE 64)  30 mL Per Tube BID  . feeding supplement (VITAL HIGH PROTEIN)  1,000 mL Per Tube Q24H  . fentaNYL      . Folic Acid-Vit B6-Vit B12  1 tablet Oral Daily  . hydrocortisone sodium succinate  50 mg Intravenous Q6H  . ipratropium-albuterol  3 mL Nebulization Q6H  . mouth rinse  15 mL Mouth Rinse q12n4p  . midazolam      . pantoprazole (PROTONIX) IV  40 mg Intravenous Q24H  . piperacillin-tazobactam  3.375 g Intravenous Q6H  . sevelamer carbonate  800 mg Oral TID WC  . sodium chloride flush  10-40 mL Intracatheter Q12H  . sodium chloride flush  3 mL Intravenous Q12H   Continuous Infusions: . amiodarone 30 mg/hr (01/14/17 2145)  . dextrose 10 mL/hr at 01/14/17 2145  . norepinephrine 40 mcg/min (01/15/17 0141)  . dialysis replacement fluid (prismasate) 300 mL/hr at 01/14/17 1704  . dialysis replacement fluid (prismasate) 300 mL/hr at 01/14/17 0015  . dialysate (PRISMASATE) 1,000 mL/hr at 01/15/17 0600  . vasopressin (PITRESSIN) infusion - *FOR SHOCK* 0.03 Units/min (01/14/17 2147)   PRN Meds: sodium chloride, acetaminophen, albuterol, calcium carbonate (dosed in mg elemental calcium), camphor-menthol **AND** hydrOXYzine, docusate sodium, fentaNYL (SUBLIMAZE) injection, heparin, ondansetron (ZOFRAN) IV, sodium chloride, sodium chloride flush, sodium chloride flush, sorbitol     Vital Signs    Vitals:   01/15/17 0630 01/15/17 0700 01/15/17 0800 01/15/17 0813  BP:      Pulse: 98 89    Resp: (!) 35 (!) 31 (!) 35   Temp:    (!) 95.5 F (35.3 C)  TempSrc:    Rectal  SpO2: 98% (!) 85% (!) 87%   Weight:      Height:        Intake/Output Summary (Last 24 hours) at 01/15/17 0852 Last data filed at 01/15/17 0800  Gross per 24 hour  Intake          3534.97 ml  Output             4680 ml  Net         -1145.03 ml   Filed Weights   2017-02-08 1848 01/14/17 0500 01/15/17 0600  Weight: 160 lb 9.6 oz (72.8 kg) 174 lb 2.6 oz (79 kg) 167 lb 1.7 oz (75.8 kg)    Telemetry    Atrial fibrillation with rates in the 80's - Personally Reviewed  ECG    No new Ecgs  Physical Exam   GEN: On dialysis and BIPAP Neck: + JVD to jaw, left IJ line in place. Right arm very edematous Cardiac: IRRR, no murmurs, rubs, or gallops.  Respiratory: bilateral crackles GI: Soft, nontender, non-distended  MS: 1+ edema; No deformity. Neuro:  Nonfocal   Labs    Chemistry Recent Labs Lab 02-08-2017 1144  01/14/17 0320 01/14/17 1600 01/15/17 0424  NA 133*  < >  130* 132* 134*  K 3.4*  < > 4.2 4.3 4.7  CL 95*  < > 96* 98* 97*  CO2 28  < > 15* 13* 10*  GLUCOSE 171*  < > 162* 142* 94  BUN 24*  < > 36* 28* 24*  CREATININE 3.51*  < > 4.04* 3.01* 2.40*  CALCIUM 9.0  < > 8.1* 8.1* 8.7*  PROT 7.4  --   --   --   --   ALBUMIN 3.2*  --  2.5* 2.5* 2.6*  AST 20  --   --   --   --   ALT 15*  --   --   --   --   ALKPHOS 99  --   --   --   --   BILITOT 1.1  --   --   --   --   GFRNONAA 15*  < > 13* 18* 24*  GFRAA 17*  < > 15* 21* 27*  ANIONGAP 10  < > 19* 21* 27*  < > = values in this interval not displayed.   Hematology  Recent Labs Lab 01/13/17 0053 01/14/17 0320 01/15/17 0424  WBC 6.2 30.4* 29.5*  RBC 2.95* 2.63* 2.49*  HGB 9.4* 8.2* 7.8*  HCT 30.7* 26.4* 25.3*  MCV 104.1* 100.4* 101.6*  MCH 31.9 31.2 31.3  MCHC 30.6 31.1 30.8  RDW 18.4* 18.3* 18.7*  PLT 154 110*  65*    Cardiac Enzymes  Recent Labs Lab 01/13/17 0641 01/13/17 1200 01/13/17 2123 01/14/17 0000  TROPONINI 0.38* 1.34* 1.47* 1.04*   No results for input(s): TROPIPOC in the last 168 hours.   BNP  Recent Labs Lab 01/04/2017 2152  BNP 2,371.7*     DDimer   Recent Labs Lab 01/13/17 1200  DDIMER 8.19*     Radiology    Ct Angio Chest Pe W Or Wo Contrast  Result Date: 01/13/2017 CLINICAL DATA:  Initial evaluation for acute shortness of breath. EXAM: CT ANGIOGRAPHY CHEST WITH CONTRAST TECHNIQUE: Multidetector CT imaging of the chest was performed using the standard protocol during bolus administration of intravenous contrast. Multiplanar CT image reconstructions and MIPs were obtained to evaluate the vascular anatomy. CONTRAST:  100 cc of Isovue 370. COMPARISON:  Prior radiograph from earlier the same day. FINDINGS: Cardiovascular: Intrathoracic aorta of normal caliber. Extensive plaque throughout the aorta and visualized great vessels. No aneurysm. Fairly pronounced cardiomegaly with diffuse 3 vessel coronary artery calcifications. Trace pericardial fluid noted. Evaluation pulmonary arterial tree somewhat limited by timing of the contrast bolus. Evaluation for possible small distal emboli limited on this exam. Main pulmonary artery within normal limits for size measuring 3 cm in diameter. No large or central filling defect to suggest acute pulmonary embolism identified. Re-formatted imaging confirms these findings. The Mediastinum/Nodes: Visualized thyroid mildly heterogeneous but grossly unremarkable. Mildly enlarged 15 mm right paratracheal lymph node (series 5, image 40). Additional shotty subcentimeter mediastinal nodes noted. No hilar adenopathy. No appreciable axillary adenopathy. Esophagus within normal limits. Lungs/Pleura: Tracheobronchial tree is patent. Layering bilateral pleural effusions. Associated bibasilar opacities likely atelectasis, although superimposed infiltrate could  be considered in the correct clinical setting. Diffuse hazy ground-glass opacity within the lungs most likely reflects edema. Underlying paraseptal emphysema. No pneumothorax. No worrisome pulmonary nodule or mass. Upper Abdomen: Small volume ascites present within the partially visualized upper abdomen. At remainder of the partially visualized upper abdomen otherwise unremarkable. Musculoskeletal: Extensive venous collaterals present within knee right chest wall. No acute osseous abnormality. No worrisome  lytic or blastic osseous lesions. Multiple remotely healed right-sided rib fractures noted. Review of the MIP images confirms the above findings. IMPRESSION: 1. No large or central pulmonary embolism identified. Please note evaluation for possible distal small emboli somewhat limited on this exam due to timing of the contrast bolus and motion artifact. 2. Cardiomegaly with layering bilateral pleural effusions. Diffuse ground-glass opacity involving the bilateral lungs most likely reflects pulmonary edema. 3. Superimposed bibasilar airspace opacities, likely atelectasis, although superimposed infiltrate could be considered in the correct clinical setting. 4. Underlying emphysema. 5. Ascites within the visualized upper abdomen. 6. Advanced aortic atherosclerosis with diffuse 3 vessel coronary artery calcifications. Electronically Signed   By: Rise Mu M.D.   On: 01/13/2017 17:36   Dg Chest Port 1 View  Result Date: 01/14/2017 CLINICAL DATA:  Shortness of breath, CHF, end-stage renal disease, COPD. EXAM: PORTABLE CHEST 1 VIEW COMPARISON:  Portable chest x-ray dated January 13, 2017 FINDINGS: The lungs are adequately inflated. There is a persistent right pleural effusion laterally and superiorly. There is mild shift of the mediastinum toward the right which is stable. The cardiac silhouette remains enlarged. There is dense calcification in the wall of the thoracic aorta. The pulmonary vascularity is  indistinct. There is no pleural effusion. The left internal jugular venous catheter tip projects over the midportion of the SVC. Multiple right posterior rib fractures are again observed. IMPRESSION: Stable appearance of the chest since yesterday's study. CHF with mild interstitial edema. Small right pleural effusion. No pneumothorax. Multiple displaced right posterior rib fractures. Electronically Signed   By: David  [place] M.D.   On: 01/14/2017 07:59   Dg Chest Port 1 View  Result Date: 01/13/2017 CLINICAL DATA:  Encounter for central line placement. Status post unsuccessful attempt to place a central lawn. EXAM: PORTABLE CHEST 1 VIEW COMPARISON:  Chest CT earlier this day, multiple prior radiographs. FINDINGS: The tip of the left internal jugular central venous catheter is in the region of the mid SVC. No pneumothorax. No additional central line is seen. Cardiomegaly is stable. Small pleural effusions, right greater than left, unchanged. Stable pulmonary vascular engorgement probable edema. No new focal airspace disease. Vascular stent in the region of the left subclavian. IMPRESSION: Unchanged appearance of the chest. Tip of the left central line in the SVC. No pneumothorax. Electronically Signed   By: Rubye Oaks M.D.   On: 01/13/2017 21:04   Dg Chest Port 1 View  Result Date: 01/13/2017 CLINICAL DATA:  Central line care encounter EXAM: PORTABLE CHEST 1 VIEW COMPARISON:  Portable exam 1306 hours compared to 0723 hours FINDINGS: New LEFT jugular line with tip projecting over SVC. LEFT axillary stent again identified. Enlargement of cardiac silhouette with pulmonary vascular congestion. Atherosclerotic calcification aorta. RIGHT basilar atelectasis and small RIGHT pleural effusion. Remaining lungs clear. No pneumothorax. Multiple old RIGHT rib fractures. IMPRESSION: No pneumothorax following LEFT jugular line placement. Aortic atherosclerosis. Persistent mild RIGHT basilar atelectasis and small  pleural effusion. Electronically Signed   By: Ulyses Southward M.D.   On: 01/13/2017 13:18    Cardiac Studies   Echo 01/14/17: Study Conclusions  - Left ventricle: The cavity size was normal. Wall thickness was   normal. Systolic function was mildly to moderately reduced. The   estimated ejection fraction was in the range of 40% to 45%.   Diffuse hypokinesis. - Aortic valve: Moderately to severely calcified annulus. - Mitral valve: Mildly to moderately calcified annulus. There was   moderate regurgitation. - Left atrium: The atrium  was severely dilated. - Right ventricle: The cavity size was moderately to severely   dilated. Systolic function was moderately to severely reduced. - Right atrium: The atrium was moderately dilated. - Tricuspid valve: There was severe regurgitation. - Pulmonary arteries: Systolic pressure was moderately to severely   increased. PA peak pressure: 66 mm Hg (S).  Patient Profile     83 y.o. male with past medical history of COPD (on 2L [place]), ESRD (on MWF), HTN, HLD, Type 2 DM, and chronic atrial fibrillation (on Eliquis) who presented to [facility] on 25-Jan[facility]2017 for evaluation of chest pain. He was transferred to [facility] [place] and we have consulted for chest pain and elevated troponin.  Assessment & Plan    1. Chest Pain/Elevated Troponin - recently admitted at [facility] for chest pain. CXR shows  rib fractures.  - denies any prior known history of CAD or MI's. Was admitted in 09/2016 for E.coli bacteremia and had a Type 2 NSTEMI with peak troponin of 1.76 by review of Care Everywhere. Echocardiogram showed a preserved EF of 55-60%, mild MR, and moderate TR. Patient reports having a "normal" stress test just two weeks ago.  - troponin values have been 0.03, 0.05, 0.06, 0.38, 1.34, 1.47, 1.04. Peaked at 1.47 and then down to 1.04. Most likely demand ischemia in setting of sepsis, respiratory failure, ESRD, and afib with RVR. -CTA chest showed cardiomegaly with  layering bilateral pleural effusions. Diffuse ground-glass opacity involving the bilateral lungs most likely reflects pulmonary edema. Advanced aortic atherosclerosis with diffuse 3 vessel coronary artery calcifications - do not plan additional ischemic evaluation at this time.  2. Atrial Fibrillation with RVR - has chronic atrial fibrillation by review of records. Elevated rate due to multiple stressors of sepsis, fever, hypotension, acute respiratory failure. Rates now in the 80's on amiodarone. - This patients CHA2DS2-VASc Score and unadjusted Ischemic Stroke Rate (% per year) is equal to 9.7 % stroke rate/year from a score of 6 (HTN, DM, Age (2), TE(2)). On Eliquis prior to admission. This is contraindicated in setting of ESRD. Has been switched to IV Heparin. Eliquis is not advisable in ESRD. Would consider coumadin for anticoagulation once able to take po's and no invasive procedures planned.  - Rate controlled on IV amiodarone. Not a candidate for beta blocker due to shock. -Is on Levophed and Neo for BP support.   3. Acute Hypoxic Respiratory Failure/ COPD - on 2L [place] at home. O2 saturations in the 80's requiring BiPAP.  - CTA showed no large or central PE but could not rule out distal small emboli..  - management per CCM.  4. Sepsis - febrile up to 102.5 2 days ago- now hypothermic  - multiple gram negative organisms/bacteremia.  - Critical Care following. Levophed/ Neo for hemodynamic support.   5. ESRD - on HD MWF. Now on CRRT due to shock  - Nephrology is following.   6. Anemia Hgb 7.9 at outpatient HD. Hgb 7.8 today. Nephrology plans to transfuse if Hgb <7.  7. Thrombocytopenia. Concern for possible HIT. Check panel. May need to consider alternative anticoagulation. ? DVT right arm. Dopplers ordered.  8. LV dysfunction: EF down to 40-45%. This is new. Most likely related to severe sepsis and shock. Continue hemodynamic support with pressors. Will need to reassess LV  function in 4 weeks after shock/sepsis resolved.  The patient is critically ill with multiple organ systems failure and requires high complexity decision making for assessment and support, frequent evaluation and titration of  therapies, application of advanced monitoring technologies and extensive interpretation of multiple databases.  Signed,  Karrington Studnicka [place], MDFACC 01/15/2017 8:52 AM

## 2017-01-15 NOTE — Progress Notes (Signed)
RD paged to update on pt condition. Pt now NPO for Abd CT. RD to made changes in TF schedule accordingly.

## 2017-01-15 NOTE — Plan of Care (Signed)
Problem: Fluid Volume: Goal: Ability to maintain a balanced intake and output will improve Outcome: Progressing On CRRT

## 2017-01-15 NOTE — Progress Notes (Signed)
Post intubation ABG obtained on patient.  Results called and given to MD.  Ordered to increase respiratory rate to 35 and decrease FIO2 to 40%.  Will continue to monitor.    Ref. Range 01/15/2017 09:51  Sample type Unknown ARTERIAL  pH, Arterial Latest Ref Range: 7.350 - 7.450  7.084 (LL)  pCO2 arterial Latest Ref Range: 32.0 - 48.0 mmHg 33.1  pO2, Arterial Latest Ref Range: 83.0 - 108.0 mmHg 408.0 (H)  TCO2 Latest Ref Range: 0 - 100 mmol/L 11  Acid-base deficit Latest Ref Range: 0.0 - 2.0 mmol/L 19.0 (H)  Bicarbonate Latest Ref Range: 20.0 - 28.0 mmol/L 10.3 (L)  O2 Saturation Latest Units: % 100.0  Patient temperature Unknown 34.5 C  Collection site Unknown ARTERIAL LINE

## 2017-01-15 NOTE — Progress Notes (Signed)
CRITICAL VALUE ALERT  Critical value received:  Lactic Acid 7.9  Date of notification:  01/15/2017  Time of notification:  2027  Critical value read back:Yes.    Nurse who received alert:  Owens Loffler, RN  MD notified (1st page):  Pola Corn, MD  Time of first page:  2027  MD notified (2nd page):  Time of second page:  Responding MD:  Pola Corn, MD  Time MD responded:  2027

## 2017-01-15 NOTE — Progress Notes (Addendum)
[facility] Rounding Note  Subjective/Interval History:   Intubated this AM d/t resp distress Now sedated on the vent Remains pressor dependent Tolerating slow fluid removal with CRRT (started 3/28) (neg 100/hour) and still allowing for weaning of pressors  Objective Vital signs in last 24 hours: Vitals:   01/15/17 0926 01/15/17 0929 01/15/17 0930 01/15/17 0945  BP:      Pulse:   83   Resp:   20 (!) 21  Temp:    (!) 94.1 F (34.5 C)  TempSrc:      SpO2: 91% 92% 94%   Weight:      Height:       Weight change: -3.2 kg (-7 lb 0.9 oz)  Intake/Output Summary (Last 24 hours) at 01/15/17 1024 Last data filed at 01/15/17 1000  Gross per 24 hour  Intake          3245.19 ml  Output             4605 ml  Net         -1359.81 ml   Physical Exam:  Blood pressure (!) 106/33, pulse 83, temperature (!) 94.1 F (34.5 C), resp. rate (!) 21, height 5\' 9"  (1.753 m), weight 75.8 kg (167 lb 1.7 oz), SpO2 94 %.  Intubated, sedated Lines/tubes: Central line L IJ, Aline, BIPAP, R fem trialysis catheter + JVD to mandible, periorbital edema Crackles laterally chest Irreg S1S23 No S3   Abdomen: soft, not focally tender 1+ edema LE's 3+ edema RUE (had prior DVT there) with ecchymotic change, pitting Dialysis Access:   left upper arm AVF bruit at anastomosis only - I think probably occluded  R femoral trialysis catheter  Labs: Basic Metabolic Panel:  Recent Labs Lab 2017/01/25 1144 01/13/17 0641 01/14/17 0320 01/14/17 1600 01/15/17 0424  NA 133* 135 130* 132* 134*  K 3.4* 3.6 4.2 4.3 4.7  CL 95* 97* 96* 98* 97*  CO2 28 25 15* 13* 10*  GLUCOSE 171* 75 162* 142* 94  BUN 24* 34* 36* 28* 24*  CREATININE 3.51* 4.41* 4.04* 3.01* 2.40*  CALCIUM 9.0 8.8* 8.1* 8.1* 8.7*  PHOS  --   --  4.0 3.7 4.4     Recent Labs Lab January 25, 2017 1144 01/14/17 0320 01/14/17 1600 01/15/17 0424  AST 20  --   --   --   ALT 15*  --   --   --   ALKPHOS 99  --   --   --   BILITOT 1.1  --   --   --   PROT 7.4  --    --   --   ALBUMIN 3.2* 2.5* 2.5* 2.6*    Recent Labs Lab January 25, 2017 1144  LIPASE 15    Recent Labs Lab 01/13/17 0848  AMMONIA 36*     Recent Labs Lab 01-25-2017 1144 01/13/17 0053 01/14/17 0320 01/15/17 0424  WBC 12.0* 6.2 30.4* 29.5*  NEUTROABS  --  5.6  --   --   HGB 9.1* 9.4* 8.2* 7.8*  HCT 28.7* 30.7* 26.4* 25.3*  MCV 104.4* 104.1* 100.4* 101.6*  PLT 163 154 110* 65*    Recent Labs Lab 01/13/17 0053 01/13/17 0641 01/13/17 1200 01/13/17 2123 01/14/17 0000  TROPONINI 0.06* 0.38* 1.34* 1.47* 1.04*   CBG:  Recent Labs Lab 01/14/17 1938 01/15/17 0008 01/15/17 0401 01/15/17 0802 01/15/17 0828  GLUCAP 145* 93 91 41* 142*    Studies/Results: Ct Angio Chest Pe W Or Wo Contrast  Result Date: 01/13/2017 CLINICAL DATA:  Initial evaluation for acute shortness of breath. EXAM: CT ANGIOGRAPHY CHEST WITH CONTRAST TECHNIQUE: Multidetector CT imaging of the chest was performed using the standard protocol during bolus administration of intravenous contrast. Multiplanar CT image reconstructions and MIPs were obtained to evaluate the vascular anatomy. CONTRAST:  100 cc of Isovue 370. COMPARISON:  Prior radiograph from earlier the same day. FINDINGS: Cardiovascular: Intrathoracic aorta of normal caliber. Extensive plaque throughout the aorta and visualized great vessels. No aneurysm. Fairly pronounced cardiomegaly with diffuse 3 vessel coronary artery calcifications. Trace pericardial fluid noted. Evaluation pulmonary arterial tree somewhat limited by timing of the contrast bolus. Evaluation for possible small distal emboli limited on this exam. Main pulmonary artery within normal limits for size measuring 3 cm in diameter. No large or central filling defect to suggest acute pulmonary embolism identified. Re-formatted imaging confirms these findings. The Mediastinum/Nodes: Visualized thyroid mildly heterogeneous but grossly unremarkable. Mildly enlarged 15 mm right paratracheal lymph  node (series 5, image 40). Additional shotty subcentimeter mediastinal nodes noted. No hilar adenopathy. No appreciable axillary adenopathy. Esophagus within normal limits. Lungs/Pleura: Tracheobronchial tree is patent. Layering bilateral pleural effusions. Associated bibasilar opacities likely atelectasis, although superimposed infiltrate could be considered in the correct clinical setting. Diffuse hazy ground-glass opacity within the lungs most likely reflects edema. Underlying paraseptal emphysema. No pneumothorax. No worrisome pulmonary nodule or mass. Upper Abdomen: Small volume ascites present within the partially visualized upper abdomen. At remainder of the partially visualized upper abdomen otherwise unremarkable. Musculoskeletal: Extensive venous collaterals present within knee right chest wall. No acute osseous abnormality. No worrisome lytic or blastic osseous lesions. Multiple remotely healed right-sided rib fractures noted. Review of the MIP images confirms the above findings. IMPRESSION: 1. No large or central pulmonary embolism identified. Please note evaluation for possible distal small emboli somewhat limited on this exam due to timing of the contrast bolus and motion artifact. 2. Cardiomegaly with layering bilateral pleural effusions. Diffuse ground-glass opacity involving the bilateral lungs most likely reflects pulmonary edema. 3. Superimposed bibasilar airspace opacities, likely atelectasis, although superimposed infiltrate could be considered in the correct clinical setting. 4. Underlying emphysema. 5. Ascites within the visualized upper abdomen. 6. Advanced aortic atherosclerosis with diffuse 3 vessel coronary artery calcifications. Electronically Signed   By: Rise Mu M.D.   On: 01/13/2017 17:36   Dg Chest Port 1 View  Result Date: 01/15/2017 CLINICAL DATA:  Intubation EXAM: PORTABLE CHEST 1 VIEW COMPARISON:  01/14/2017 FINDINGS: Endotracheal tube is 4.5 cm above the carina.  Mild cardiomegaly. Mild interstitial prominence again noted, likely mild interstitial edema. Bibasilar atelectasis. Small effusion. No change since prior study. Left central line tip remains in the SVC. IMPRESSION: Endotracheal tube 4.5 cm above the carina. Otherwise no significant change. Electronically Signed   By: Charlett Nose M.D.   On: 01/15/2017 10:06   Dg Chest Port 1 View  Result Date: 01/14/2017 CLINICAL DATA:  Shortness of breath, CHF, end-stage renal disease, COPD. EXAM: PORTABLE CHEST 1 VIEW COMPARISON:  Portable chest x-ray dated January 13, 2017 FINDINGS: The lungs are adequately inflated. There is a persistent right pleural effusion laterally and superiorly. There is mild shift of the mediastinum toward the right which is stable. The cardiac silhouette remains enlarged. There is dense calcification in the wall of the thoracic aorta. The pulmonary vascularity is indistinct. There is no pleural effusion. The left internal jugular venous catheter tip projects over the midportion of the SVC. Multiple right posterior rib fractures are again observed. IMPRESSION: Stable appearance of the chest  since yesterday's study. CHF with mild interstitial edema. Small right pleural effusion. No pneumothorax. Multiple displaced right posterior rib fractures. Electronically Signed   By: David  [place] M.D.   On: 01/14/2017 07:59   Dg Chest Port 1 View  Result Date: 01/13/2017 CLINICAL DATA:  Encounter for central line placement. Status post unsuccessful attempt to place a central lawn. EXAM: PORTABLE CHEST 1 VIEW COMPARISON:  Chest CT earlier this day, multiple prior radiographs. FINDINGS: The tip of the left internal jugular central venous catheter is in the region of the mid SVC. No pneumothorax. No additional central line is seen. Cardiomegaly is stable. Small pleural effusions, right greater than left, unchanged. Stable pulmonary vascular engorgement probable edema. No new focal airspace disease. Vascular stent  in the region of the left subclavian. IMPRESSION: Unchanged appearance of the chest. Tip of the left central line in the SVC. No pneumothorax. Electronically Signed   By: Rubye Oaks M.D.   On: 01/13/2017 21:04   Dg Chest Port 1 View  Result Date: 01/13/2017 CLINICAL DATA:  Central line care encounter EXAM: PORTABLE CHEST 1 VIEW COMPARISON:  Portable exam 1306 hours compared to 0723 hours FINDINGS: New LEFT jugular line with tip projecting over SVC. LEFT axillary stent again identified. Enlargement of cardiac silhouette with pulmonary vascular congestion. Atherosclerotic calcification aorta. RIGHT basilar atelectasis and small RIGHT pleural effusion. Remaining lungs clear. No pneumothorax. Multiple old RIGHT rib fractures. IMPRESSION: No pneumothorax following LEFT jugular line placement. Aortic atherosclerosis. Persistent mild RIGHT basilar atelectasis and small pleural effusion. Electronically Signed   By: Ulyses Southward M.D.   On: 01/13/2017 13:18   Medications: . amiodarone 30 mg/hr (01/15/17 1000)  . dextrose 10 mL/hr at 01/14/17 2145  . norepinephrine 45 mcg/min (01/15/17 1000)  . dialysis replacement fluid (prismasate) 300 mL/hr at 01/15/17 1014  . dialysis replacement fluid (prismasate) 300 mL/hr at 01/15/17 1024  . dialysate (PRISMASATE) 1,000 mL/hr at 01/15/17 0600  . vasopressin (PITRESSIN) infusion - *FOR SHOCK* 0.03 Units/min (01/14/17 2147)   . atorvastatin  20 mg Oral q1800  . budesonide (PULMICORT) nebulizer solution  0.25 mg Nebulization BID  . chlorhexidine  15 mL Mouth Rinse BID  . Chlorhexidine Gluconate Cloth  6 each Topical Q0600  . darbepoetin (ARANESP) injection - NON-DIALYSIS  150 mcg Subcutaneous Q Thu-1800  . feeding supplement (PRO-STAT SUGAR FREE 64)  30 mL Per Tube BID  . feeding supplement (VITAL HIGH PROTEIN)  1,000 mL Per Tube Q24H  . Folic Acid-Vit B6-Vit B12  1 tablet Oral Daily  . hydrocortisone sodium succinate  50 mg Intravenous Q6H  .  ipratropium-albuterol  3 mL Nebulization Q6H  . mouth rinse  15 mL Mouth Rinse q12n4p  . pantoprazole (PROTONIX) IV  40 mg Intravenous Q24H  . piperacillin-tazobactam  3.375 g Intravenous Q6H  . sevelamer carbonate  800 mg Oral TID WC  . sodium chloride flush  10-40 mL Intracatheter Q12H  . sodium chloride flush  3 mL Intravenous Q12H   Dialysis prescription:  MWF [facility].  EDW 71 kg  3.5 hours.  450/A2.0 2K 2.5 Ca UF profile none Access AVF L upper arm 15 ga needles Venofer 100 QTMT (to stop 3/30) Mircera 200 Q2Weeks ? Last dosed 01/01/17   Assessment/Recommendations  1. Acute on chronic resp failure - s/p intubation 3/30. CXR with edema (weight still about 5 kg over EDW). Pulling fluid with CRRT. Continue neg 100/hour 2. AF with RVR/new reduction in EF to 40-45% - Amio. Heparin on hold  2/2 drop in plts 3. Klebsiella bacteremia - on zosyn. Source ? (had EColi bacteremia in 09/2016).  4. Shock - septic vs cardiogenic. Currently 3 pressors weaning. ATB's (V/Z). Cultures + Klebs. WBC 30K 5. ESRD - MWF HD. Too unstable for HD. CRRT started 3/28 through R fem trialysis catheter. About 5 kg over EDW now. Pulling 50-100/hour. Stop heparin. 6. Dialysis access - I think his AVF is partially occluded (if not totally thrombosed). Bruit only at the anastomosis. Too ill for fistulagram at this time 7. Anemia - Mircera as outpt. Hb 7.9 on 01/04/17 at outpt HD unit. Transfuse for <7. Started Aranesp 150/week. No IV Fe with bacteremia 8. h/o RUE DVT (dx Dec 2017) R arm edema worsening. Duplex of RUE  9. 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

## 2017-01-15 NOTE — Plan of Care (Signed)
Problem: Bowel/Gastric: Goal: Will not experience complications related to bowel motility Outcome: Not Progressing Unknown last BM, abd tender to palpation, hypoactive BS upon assessment

## 2017-01-15 NOTE — Progress Notes (Signed)
Patient has been running low temperatures on CRRT and complaining that he is too hot with the [facility] and warm blankets. Blood warmer is on for CRRT set at 42.5C. When checking midnight temperature, it would not read. Warm blankets placed on patient and bear hugger placed with more patient education. Rectal temp taken and reading 92.39F. Patient alert and oriented and complaining that he is still hot, despite temperature. Blood warmer increased to 43C and bear hugger still on patient. Deterding, MD called and notified. No new orders given at this time.

## 2017-01-15 NOTE — Progress Notes (Signed)
Hypoglycemic Event  CBG: 41  Treatment: D50 IV 50 mL  Symptoms: Nervous/irritable, slurred speech, delayed repsonses  Follow-up CBG: Time:0825 CBG Result:142  Possible Reasons for Event: Other: Pt NPO, D5 at10cc/hr ordered 3/29  Comments/MD notified:MD notified and to make changes accordingly. Will continue to monitor and assess pt closely    Ignacia Palma RN

## 2017-01-16 ENCOUNTER — Encounter (HOSPITAL_COMMUNITY): Payer: Medicare Other

## 2017-01-16 LAB — PHOSPHORUS: Phosphorus: 4.9 mg/dL — ABNORMAL HIGH (ref 2.5–4.6)

## 2017-01-16 LAB — RENAL FUNCTION PANEL
ANION GAP: 25 — AB (ref 5–15)
Albumin: 2.4 g/dL — ABNORMAL LOW (ref 3.5–5.0)
BUN: 21 mg/dL — ABNORMAL HIGH (ref 6–20)
CHLORIDE: 101 mmol/L (ref 101–111)
CO2: 7 mmol/L — ABNORMAL LOW (ref 22–32)
CREATININE: 1.88 mg/dL — AB (ref 0.61–1.24)
Calcium: 8.5 mg/dL — ABNORMAL LOW (ref 8.9–10.3)
GFR, EST AFRICAN AMERICAN: 37 mL/min — AB (ref >60)
GFR, EST NON AFRICAN AMERICAN: 32 mL/min — AB (ref >60)
Glucose, Bld: 54 mg/dL — ABNORMAL LOW (ref 65–99)
Phosphorus: 4.9 mg/dL — ABNORMAL HIGH (ref 2.5–4.6)
Potassium: 6 mmol/L — ABNORMAL HIGH (ref 3.5–5.1)
SODIUM: 133 mmol/L — AB (ref 135–145)

## 2017-01-16 LAB — GLUCOSE, CAPILLARY
Glucose-Capillary: 153 mg/dL — ABNORMAL HIGH (ref 65–99)
Glucose-Capillary: 51 mg/dL — ABNORMAL LOW (ref 65–99)

## 2017-01-16 LAB — CBC
HCT: 23.2 % — ABNORMAL LOW (ref 39.0–52.0)
HEMOGLOBIN: 7 g/dL — AB (ref 13.0–17.0)
MCH: 31.4 pg (ref 26.0–34.0)
MCHC: 30.2 g/dL (ref 30.0–36.0)
MCV: 104 fL — AB (ref 78.0–100.0)
PLATELETS: 38 10*3/uL — AB (ref 150–400)
RBC: 2.23 MIL/uL — AB (ref 4.22–5.81)
RDW: 19.4 % — ABNORMAL HIGH (ref 11.5–15.5)
WBC: 20.7 10*3/uL — AB (ref 4.0–10.5)

## 2017-01-16 LAB — MAGNESIUM: MAGNESIUM: 2.6 mg/dL — AB (ref 1.7–2.4)

## 2017-01-16 LAB — HEPARIN LEVEL (UNFRACTIONATED): HEPARIN UNFRACTIONATED: 0.77 [IU]/mL — AB (ref 0.30–0.70)

## 2017-01-16 LAB — APTT: APTT: 73 s — AB (ref 24–36)

## 2017-01-16 MED ORDER — DEXTROSE 50 % IV SOLN
INTRAVENOUS | Status: AC
  Administered 2017-01-16: 50 mL via INTRAVENOUS
  Filled 2017-01-16: qty 50

## 2017-01-16 MED ORDER — DEXTROSE 50 % IV SOLN
50.0000 mL | Freq: Once | INTRAVENOUS | Status: AC
  Administered 2017-01-16: 50 mL via INTRAVENOUS
  Filled 2017-01-16: qty 50

## 2017-01-16 MED ORDER — SODIUM CHLORIDE 0.9 % IV SOLN
10.0000 mg/h | INTRAVENOUS | Status: DC
  Administered 2017-01-16: 10 mg/h via INTRAVENOUS
  Filled 2017-01-16: qty 10

## 2017-01-16 MED ORDER — DEXTROSE 50 % IV SOLN
50.0000 mL | Freq: Once | INTRAVENOUS | Status: AC
  Administered 2017-01-16: 50 mL via INTRAVENOUS

## 2017-01-16 MED ORDER — MORPHINE BOLUS VIA INFUSION
5.0000 mg | INTRAVENOUS | Status: DC | PRN
  Filled 2017-01-16: qty 20

## 2017-01-17 NOTE — Progress Notes (Signed)
D/W Dr. Tyson Alias - plan is full comfort care due to progressive multi-organ failure secondary to sepsis. Cardiology will sign-off. Call with questions.  Chrystie Nose, MD, [facility] Attending Cardiologist [facility]

## 2017-01-17 NOTE — Progress Notes (Signed)
PULMONARY / CRITICAL CARE MEDICINE   Name: Louis Sutton MRN: [id number] DOB: May 14, 1932    ADMISSION DATE:  01/13/2017 CONSULTATION DATE: 01/13/2017  REFERRING MD:  Dr. Sloan Leiter   CHIEF COMPLAINT:  Chest Pain/Dyspnea   HISTORY OF PRESENT ILLNESS:   83 year old male with PMH of Afib, Asthma, CHF, COPD (On 2L [place]), DM, HTN, Anemia, NSTEMI, ESRD on HD MWF and recent RUE DVT on Eliquis. Presents to ED on 3/27 with reported progressive dyspnea with abdominal and chest pain. Patient was admitted to [facility] on 3/23 and then discharged 3/27 with reported symptoms reoccurring after 6 hours of discharge. Upon arrival to ED patient was alert and oriented complaining of active chest pain - flat troponins, with BP 63-893T Systolic and HR 342-876 in Afib. Admitted to Step-down. Overnight on 3/27 patient went into Afib with RVR, treated with Cardizem and Metoprolol. This morning systolic 81-15 in Afib with HR 110-130, and hypoxic requiring BIPAP. PCCM asked to consult.   Patient had a recent hospital stay from 12/29-1/13 for sepsis secondary to E. Coli Bacteriemia. During this stay patient was found to have an adrenal nodule and RUE DVT in which was treated with Eliquis. After discharge was sent to Rehab and then ultimately assisted living.   SUBJECTIVE:  MODS Levo at 42 dnr   VITAL SIGNS: BP (!) 97/30   Pulse 98   Temp (!) 95.5 F (35.3 C)   Resp (!) 35   Ht 5' 9"  (1.753 m)   Wt 76.4 kg (168 lb 6.9 oz)   SpO2 90%   BMI 24.87 kg/m   HEMODYNAMICS:    VENTILATOR SETTINGS: Vent Mode: PRVC FiO2 (%):  [40 %] 40 % Set Rate:  [35 bmp] 35 bmp Vt Set:  [520 mL] 520 mL PEEP:  [5 cmH20] 5 cmH20 Plateau Pressure:  [23 cmH20-29 cmH20] 27 cmH20  INTAKE / OUTPUT: I/O last 3 completed shifts: In: 5775.6 [I.V.:4056.6; Other:50; NG/GT:1369; IV BWIOMBTDH:741] Out: 7097 [ULAGT:3646]      PHYSICAL EXAMINATION: General:  Adult male, no distress Neuro:  rass -4 HEENT:  Normocephalic, ett   Cardiovascular:  Afib,controlled rate , NI S1/S2 Lungs:  Rhonchi  Abdomen:  Active bowel sounds low, non-tender, distended  Musculoskeletal:  No acute, no edema  Skin:  Warm, dry, intact   LABS:  BMET  Recent Labs Lab 01/15/17 0424 01/15/17 1550 01/22/17 0330  NA 134* 131* 133*  K 4.7 4.7 6.0*  CL 97* 98* 101  CO2 10* 12* 7*  BUN 24* 23* 21*  CREATININE 2.40* 2.01* 1.88*  GLUCOSE 94 129* 54*   Electrolytes  Recent Labs Lab 01/15/17 0424 01/15/17 1041 01/15/17 1550 01-22-2017 0330  CALCIUM 8.7*  --  7.9* 8.5*  MG 2.4 2.4 2.1 2.6*  PHOS 4.4 5.4* 4.1  4.2 4.9*  4.9*   CBC  Recent Labs Lab 01/14/17 0320 01/15/17 0424 01-22-2017 0330  WBC 30.4* 29.5* 20.7*  HGB 8.2* 7.8* 7.0*  HCT 26.4* 25.3* 23.2*  PLT 110* 65* 38*   Coag's  Recent Labs Lab 01/13/2017 1144  01/14/17 2015 01/15/17 0424 January 22, 2017 0330  APTT  --   < > 136* 96* 73*  INR 1.63  --   --   --   --   < > = values in this interval not displayed. Sepsis Markers  Recent Labs Lab 01/13/17 0848 01/13/17 1320 01/14/17 0320 01/15/17 0424 01/15/17 1046 01/15/17 1550  LATICACIDVEN 6.3* 4.0*  --   --  12.8* 7.9*  PROCALCITON  16.83  --  24.92 14.23  --   --    ABG  Recent Labs Lab 01/13/17 0656 01/15/17 0951 01/15/17 1148  PHART 7.439 7.084* 7.211*  PCO2ART 36.2 33.1 22.2*  PO2ART 220* 408.0* 96.0   Liver Enzymes  Recent Labs Lab 12/24/2016 1144  01/15/17 0424 01/15/17 1550 2017-02-05 0330  AST 20  --   --   --   --   ALT 15*  --   --   --   --   ALKPHOS 99  --   --   --   --   BILITOT 1.1  --   --   --   --   ALBUMIN 3.2*  < > 2.6* 2.2* 2.4*  < > = values in this interval not displayed. Cardiac Enzymes  Recent Labs Lab 01/13/17 1200 01/13/17 2123 01/14/17 0000  TROPONINI 1.34* 1.47* 1.04*   Glucose  Recent Labs Lab 01/15/17 1156 01/15/17 1601 01/15/17 1933 01/15/17 2327 05-Feb-2017 0750 2017/02/05 0818  GLUCAP 94 136* 148* 104* 51* 153*   Imaging Ct Head Wo  Contrast  Result Date: 01/15/2017 CLINICAL DATA:  Altered mental status.  Patient required intubation. EXAM: CT HEAD WITHOUT CONTRAST TECHNIQUE: Contiguous axial images were obtained from the base of the skull through the vertex without intravenous contrast. COMPARISON:  None. FINDINGS: Brain: No evidence of acute infarction, hemorrhage, hydrocephalus, extra-axial collection or mass lesion/mass effect. Vascular: No hyperdense vessel or unexpected calcification. Skull: Normal. Negative for fracture or focal lesion. Sinuses/Orbits: Visualize globes and orbits are unremarkable. Mild right maxillary sinus mucosal thickening. Remaining visualized sinuses are clear. There is some soft tissue attenuation in the right middle ear cavity. Several mastoid air cells bilaterally show fluid attenuation. Other: None. IMPRESSION: 1. No acute intracranial abnormalities. Electronically Signed   By: Lajean Manes M.D.   On: 01/15/2017 15:21   Ct Abdomen Pelvis W Contrast  Result Date: 01/15/2017 CLINICAL DATA:  Patient with altered mental status. Patient required intubation. Lactic acid of 12. EXAM: CT ABDOMEN AND PELVIS WITH CONTRAST TECHNIQUE: Multidetector CT imaging of the abdomen and pelvis was performed using the standard protocol following bolus administration of intravenous contrast. CONTRAST:  148m ISOVUE-300 IOPAMIDOL (ISOVUE-300) INJECTION 61% COMPARISON:  None. FINDINGS: Lower chest: Ground-glass and more confluent airspace opacity is noted at the lung bases, most prominently in the lower lobes, left greater than right. There are minimal pleural effusions. Paraseptal emphysema is noted along the anterior margin of the visualized right lung. The heart is mild-to-moderately enlarged. There are dense coronary artery calcifications. Hepatobiliary: Liver is normal in size. Note liver mass or focal lesion. Relative decreased attenuation noted in the left lobe may be artifact related to the adjacent dense contrast in the  stomach. Gallbladder is moderately distended. Wall is mildly prominent. No convincing gallstones. No bile duct dilation. Pancreas: Unremarkable. No pancreatic ductal dilatation or surrounding inflammatory changes. Spleen: Normal in size without focal abnormality. Adrenals/Urinary Tract: No adrenal masses. Marked bilateral renal atrophy with numerous, primarily low-attenuation, renal masses, consistent with cysts. No hydronephrosis. Ureters are not well visualized. Ureters are normal course and in caliber. Bladder is decompressed. Stomach/Bowel: Stomach is distended with contrast and air. No stomach wall thickening or mass. Nasogastric tube tip projects near the pylorus. Small bowel is normal in caliber. No wall thickening. There are scattered colonic diverticula. No evidence of diverticulitis or other bowel inflammation. Normal appendix is visualized. Vascular/Lymphatic: Dense atherosclerotic calcifications are noted along the course of a normal caliber abdominal aorta and  throughout its branch vessels. No adenopathy. Reproductive: Unremarkable. Other: There is a small amount of ascites collecting primarily adjacent to the liver. Subcutaneous edema is also evident mostly on the right. No abdominal wall hernia. Musculoskeletal: No fracture or acute finding. No osteoblastic or osteolytic lesions. IMPRESSION: 1. Ground-glass and patchy airspace consolidation in the lung bases, most evident in the left lower lobe. This could be due to a combination of atelectasis and edema. Pneumonia should be considered likely in the proper clinical setting, however. Trace pleural effusions. 2. No convincing acute abnormality within the abdomen pelvis. 3. Small amount ascites.  There is also subcutaneous edema. 4. Atrophic kidneys consistent with end-stage renal disease. 5. Significant aortic and branch vessel atherosclerosis. Electronically Signed   By: Lajean Manes M.D.   On: 01/15/2017 15:31   Dg Chest Port 1 View  Result Date:  01/15/2017 CLINICAL DATA:  Intubation EXAM: PORTABLE CHEST 1 VIEW COMPARISON:  01/14/2017 FINDINGS: Endotracheal tube is 4.5 cm above the carina. Mild cardiomegaly. Mild interstitial prominence again noted, likely mild interstitial edema. Bibasilar atelectasis. Small effusion. No change since prior study. Left central line tip remains in the SVC. IMPRESSION: Endotracheal tube 4.5 cm above the carina. Otherwise no significant change. Electronically Signed   By: Rolm Baptise M.D.   On: 01/15/2017 10:06   STUDIES:  CXR 3/27 > small right pleural effusion, right lower lobe airspace diease, mild left lower lobe airspace disease and small left effusion, multiple displaced right rib fractures.  CXR 3/28 > Cardiomegaly, aortic atherosclerosis, interstitial and early alveolar pulmonary edema, small pleural effusions/pleural scarring right more then left, right-sided rib fractures ECHO 3/28>45%  Pa 66 CT abdo 3/30>>>ground glass lungs, no acute  CULTURES: Blood 3/28 >   ANTIBIOTICS: Cefepime 3/27 > 3/28 Vancomycin 3/27 >3/30 Zosyn 3/28 >>>3/30 CTX 3/30>>>  SIGNIFICANT EVENTS: 3/27 > Presented to ED dyspnea/chest pain  3/30- shock worsening, NIMV restarted, high pressors 3/31- MODS, not survivable   LINES/TUBES: L IJ TLC 3/28>>> R femoral HD 3/29>>> ett 3/30>>>   ASSESSMENT / PLAN:  PULMONARY A: Acute on Chronic Hypoxic Respiratory Failure PE vs HCAP vs Volume Overload vs Splinting secondary to Rib FX -CXR revealed right sided rib fractures (unknown sure if new or old) P:   Uncompensated met acidosis Max MV rate No sbt Peep remain 5 Even balance goals  CARDIOVASCULAR NEW acute septic cardiomyoapthy likelyA:   Afib with RVR Recent DVT on Eliquis  H/O HTN, HLD Septic shock rel AI P:  Levo maxed, no role increase, wont change outcome Continue Ammo Gtt Continue Heparin GTT Maintain MAP >60 or sys 90 Vaso keep Stress roids  RENAL A:   ESRD on HD MWF  P:   cvvhd  even  GASTROINTESTINAL A:   H/O Cirrhosis  P:   PPI TF trickle  HEMATOLOGIC A:   Anemia  H/O Recent DVT on eliquis  r/o dic Thrombocytopenia ( sepsis) P:  Heparin, holding Duplex upper rt pending  INFECTIOUS A:   Sepsis from unknown etiology with recent E.Coli Bacteremia  r/o RUE septic thombophlebitiis vs bacterial translocation abod  Vs from HD ( intrvacs infection) - kleib bacteremia P:   CTX Doppler arm  ENDOCRINE A:   Hypoglycemia Rel AI  P:   KVO IVF Q4H glucose checks  Hold SSI Stress roids  NEUROLOGIC A:   Encephalopathic septic P:   RASS goal: -1   Will discuss comfort care with family, not survivable  Ccm time 30 min   Lavon Paganini. Titus Mould,  MD, FACP Pgr: [facility]   Extensive discussion with family med poa niece and sisters. We discussed the poor prognosis and likely poor quality of life. Family has decided to offer full comfort care. They are aware that the patient may be transferred to palliative care floor for continued comfort care needs. They have been fully updated on the process and expectations.

## 2017-01-17 NOTE — Progress Notes (Signed)
Bronson Curb RN and Gillis Ends RN at the bedside to auscultate heart tones for one minute. None were auscultated. Tele strip showing asystole printed. Time of death 1101. Family at the bedside.

## 2017-01-17 NOTE — Progress Notes (Signed)
RT note- withdrawal extubation per MD order.

## 2017-01-17 NOTE — Progress Notes (Signed)
Hypoglycemic Event  CBG: 52  Treatment: D50 IV 50 mL  Symptoms: None  Follow-up CBG: Time: 0358 CBG Result: 156  Possible Reasons for Event: Unknown  Comments/MD notified: Protocol followed with progressive results    Louis Sutton

## 2017-01-17 NOTE — Progress Notes (Signed)
Hypoglycemic Event  CBG: 51  Treatment: D50 IV 50 mL  Symptoms: None  Follow-up CBG: [id number] CBG Result:153  Possible Reasons for Event: Unknown     Ignacia Palma RN

## 2017-01-17 NOTE — Progress Notes (Signed)
Bronson Curb RN and Gillis Ends RN wasted ~240cc of Morphine down the sink.

## 2017-01-17 DEATH — deceased

## 2017-01-18 LAB — GLUCOSE, CAPILLARY
GLUCOSE-CAPILLARY: 156 mg/dL — AB (ref 65–99)
Glucose-Capillary: 52 mg/dL — ABNORMAL LOW (ref 65–99)

## 2017-01-27 ENCOUNTER — Telehealth: Payer: Self-pay

## 2017-01-27 NOTE — Telephone Encounter (Signed)
On 01/27/2017 I received a death certificate from [facility] (original). The death certificate is for burial. The patient is a patient of Doctor Tyson Alias. The death certificate will be taken to [employer] this pm for signature.  On 02-08-17 I received the death certificate back from Doctor Tyson Alias.  I got the death certificate ready and called the funeral home to let them know that Alona Bene with [employer] will be by to pickup the death certificate.

## 2017-02-03 ENCOUNTER — Telehealth: Payer: Self-pay

## 2017-02-03 NOTE — Telephone Encounter (Signed)
On 02/03/17 I received a death certificate from [place] with [employer]. The original d/c was on the wrong form. The original death certificate was signed by Doctor Brooks Sailors. The death certificate will be taken to [facility] this pm for signature on the correct form.  On 2017-02-06 I received the death certificate back from Doctor Feinstien. I got the death certificate ready and left Dennis Bast with [employer] a voicemail that the death certificate is ready for pickup.

## 2017-02-16 NOTE — Discharge Summary (Signed)
83 year old male with PMH of Afib, Asthma, CHF, COPD (On 2L [place]), DM, HTN, Anemia, NSTEMI, ESRD on HD MWF and recent RUE DVT on Eliquis. Presents to ED on 3/27 with reported progressive dyspnea with abdominal and chest pain. Patient was admitted to [facility] on 3/23 and then discharged 3/27 with reported symptoms reoccurring after 6 hours of discharge. Upon arrival to ED patient was alert and oriented complaining of active chest pain - flat troponins, with BP 90-100s Systolic and HR 110-120 in Afib. Admitted to Step-down. Overnight on 3/27 patient went into Afib with RVR, treated with Cardizem and Metoprolol. This morning systolic 60-80 in Afib with HR 110-130, and hypoxic requiring BIPAP. PCCM asked to consult.   Patient had a recent hospital stay from 12/29-1/13 for sepsis secondary to E. Coli Bacteriemia. During this stay patient was found to have an adrenal nodule and RUE DVT in which was treated with Eliquis. After discharge was sent to Rehab and then ultimately assisted living.  Progressed to   MODS, Levo at 75 and dnr was established CT done no source  BC pos gram neg rod Klebs bacteremia, from source unclear,  HC source?,  bactreial translocation ? Vs septic thombophlebitis  Not survivable Family opted comfort care  Final Diagnosis upon Death  1. Gram negative, Klebsiella bacteremia - septic shock 2. MODS 3. Acute resp failure 4. ESRD 5. Septic shock 6. Afib RVR 7. Comfort care  Mcarthur Rossetti. Tyson Alias, MD, FACP Pgr: [phone] [facility] \

## 2017-11-04 IMAGING — CT CT HEAD W/O CM
3 series · 14 of 47 positions shown, 16 images · non-contrast
Comparison: None.

CLINICAL DATA: Altered mental status.  Patient required intubation.

EXAM:
CT HEAD WITHOUT CONTRAST
TECHNIQUE: Contiguous axial images were obtained from the base of the skull
through the vertex without intravenous contrast.

[Series 3: head 5.0 h30s · axial · 0.42mm/px · z∈[-143,-8]mm · 8 of 33 slices shown, 10 images]
[im 3/33  brain]
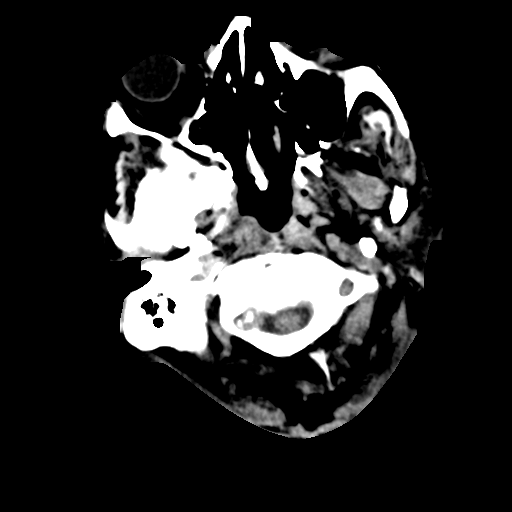
[im 3/33  bone]
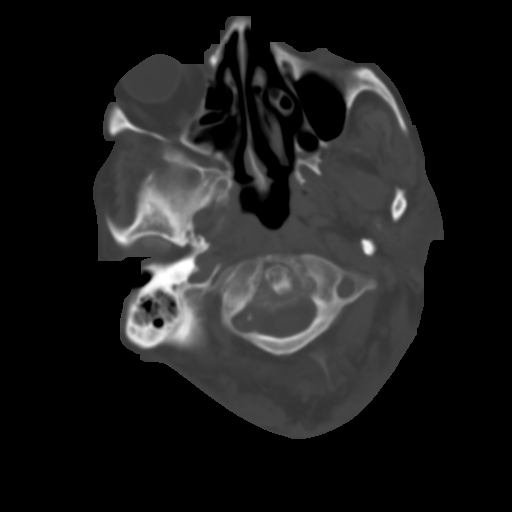
[im 7/33  brain]
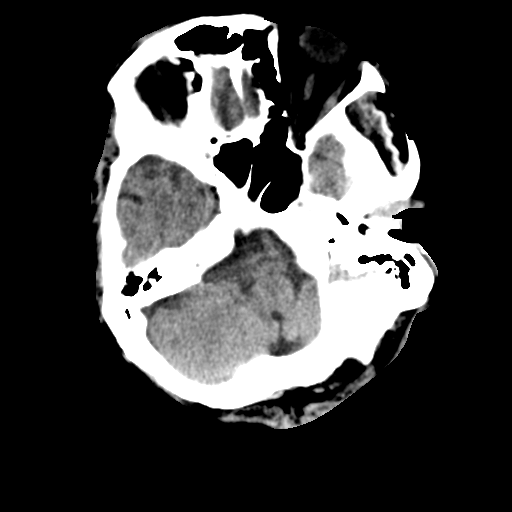
[im 10/33  brain]
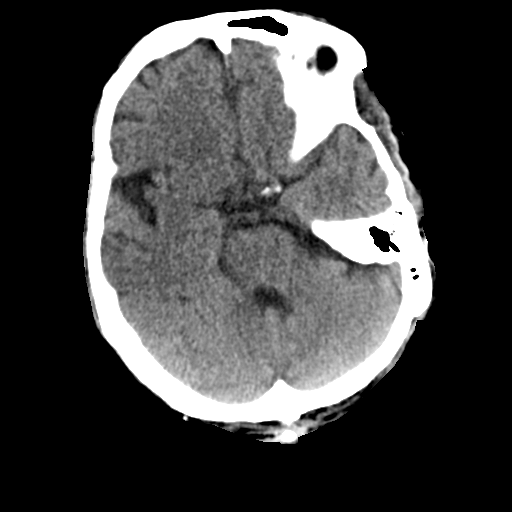
[im 15/33  brain]
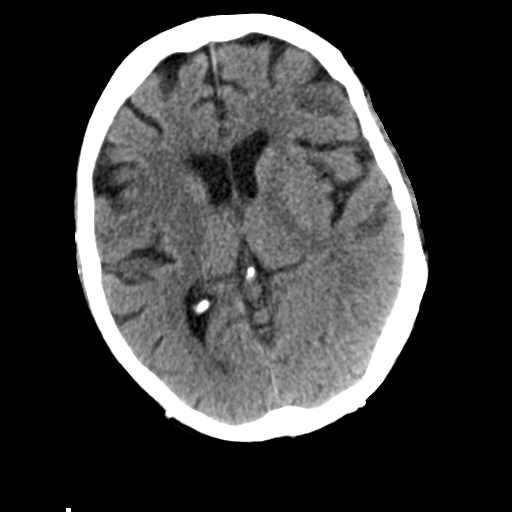
[im 18/33  brain]
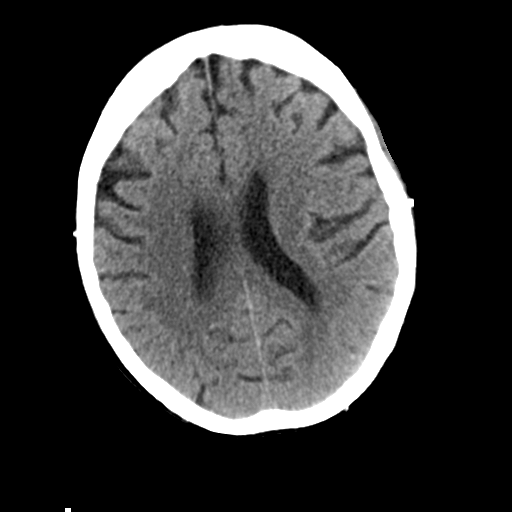
[im 18/33  bone]
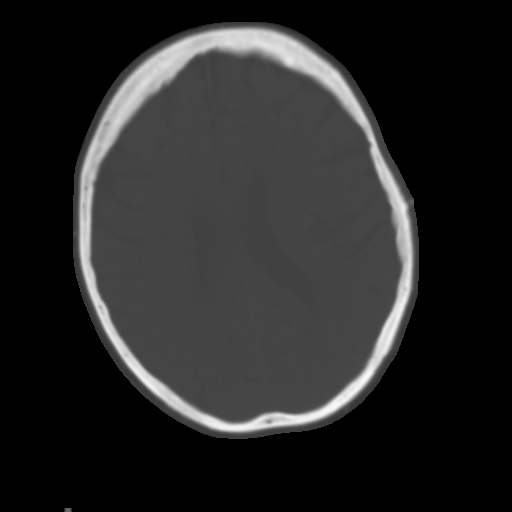
[im 23/33  brain]
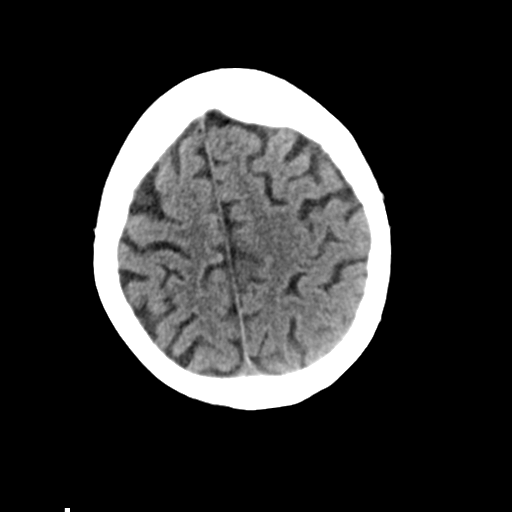
[im 26/33  brain]
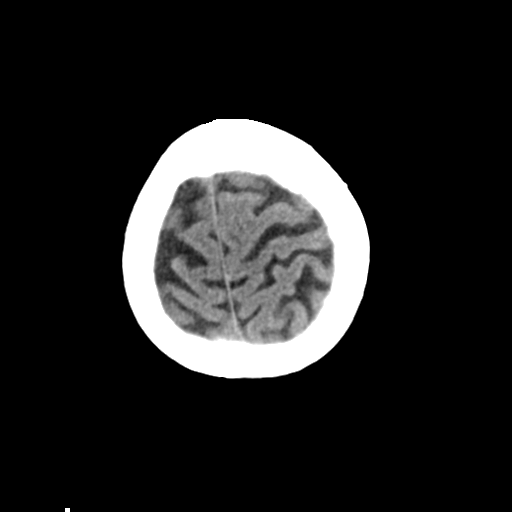
[im 30/33  brain]
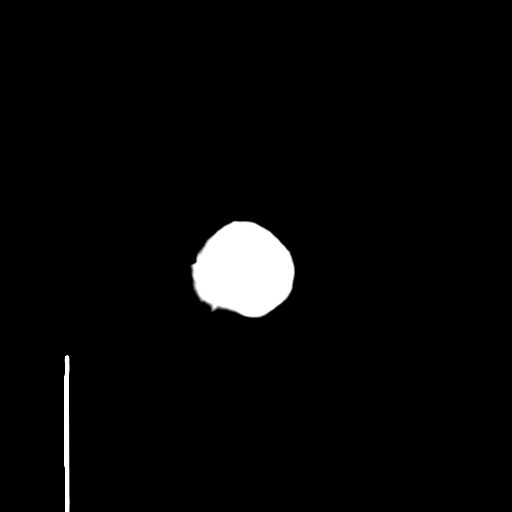

[Series 5: head 3.0 mpr cor · coronal · 0.32mm/px · 3 of 67 slices shown]
[im 23/67  brain]
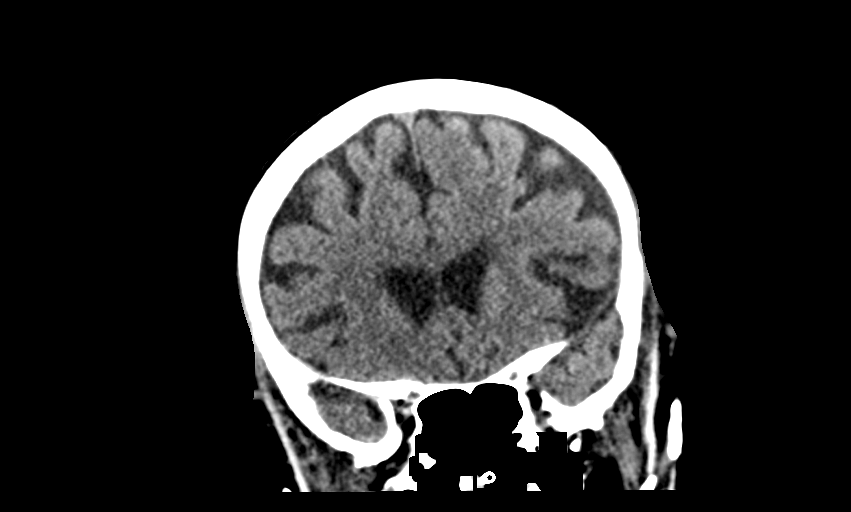
[im 30/67  brain]
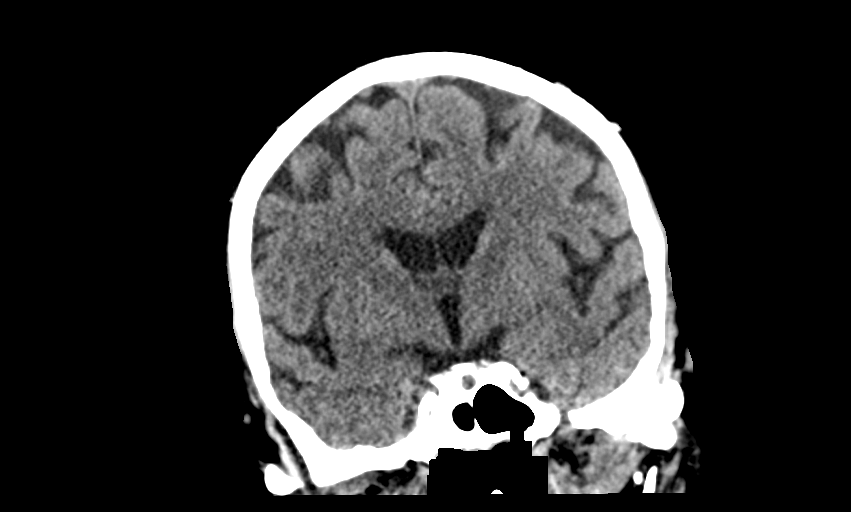
[im 37/67  brain]
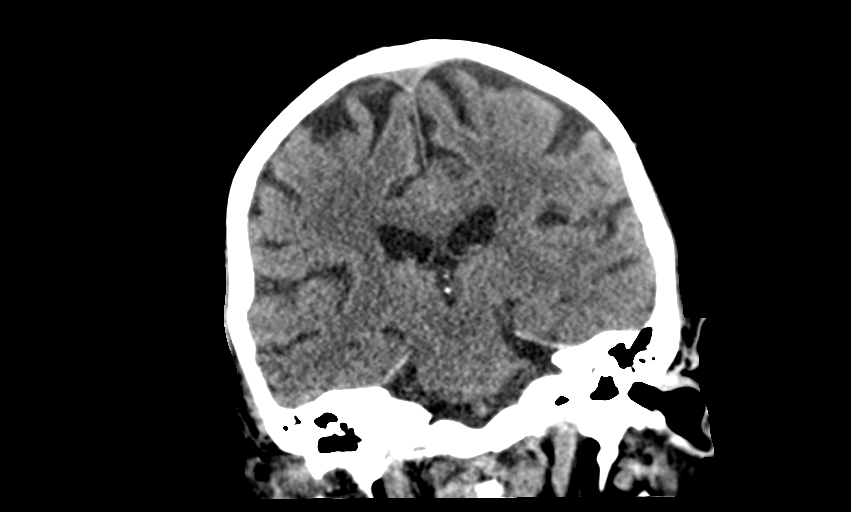

[Series 6: head 3.0 mpr sag · sagittal · 0.32mm/px · 3 of 67 slices shown]
[im 23/67  brain]
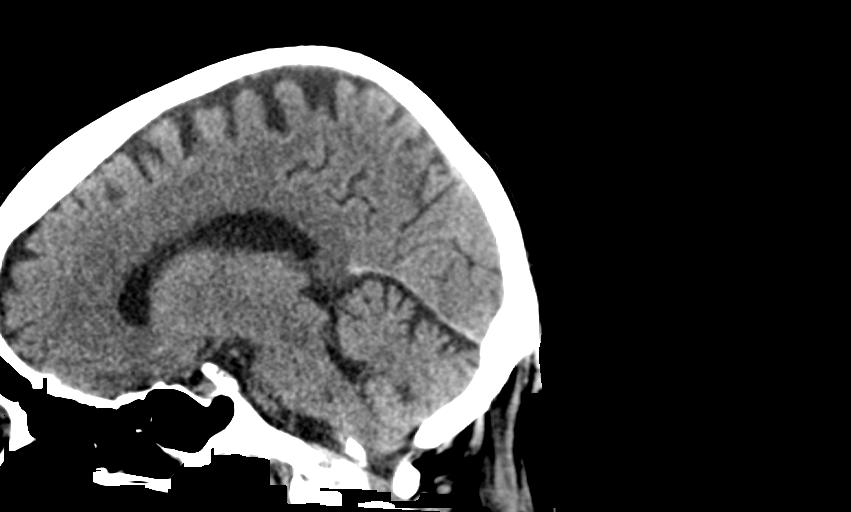
[im 34/67  brain]
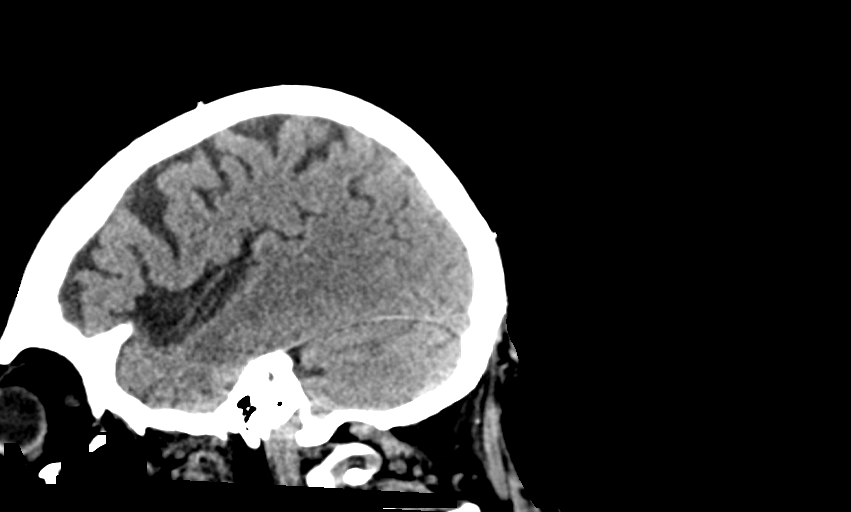
[im 45/67  brain]
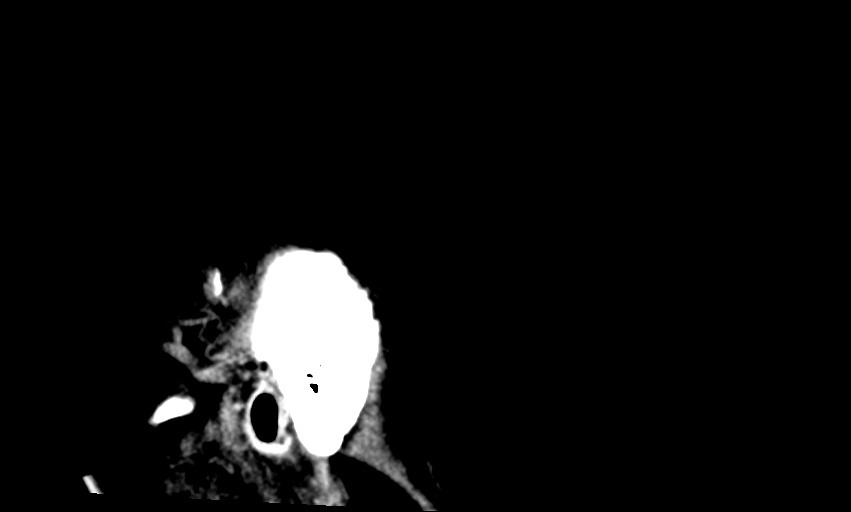

[14 of 47 positions shown; findings below may reference images not displayed]

FINDINGS: Brain: No evidence of acute infarction, hemorrhage, hydrocephalus,
extra-axial collection or mass lesion/mass effect.

Vascular: No hyperdense vessel or unexpected calcification.

Skull: Normal. Negative for fracture or focal lesion.

Sinuses/Orbits: Visualize globes and orbits are unremarkable. Mild
right maxillary sinus mucosal thickening. Remaining visualized
sinuses are clear. There is some soft tissue attenuation in the
right middle ear cavity. Several mastoid air cells bilaterally show
fluid attenuation.

Other: None.
IMPRESSION: 1. No acute intracranial abnormalities.
